# Patient Record
Sex: Female | Born: 1970 | Race: White | Hispanic: No | Marital: Married | Smoking: Never smoker
Health system: Southern US, Community
[De-identification: ages and names within clinical notes are randomized; demographics above are authoritative.]

## PROBLEM LIST (undated history)

## (undated) DIAGNOSIS — E079 Disorder of thyroid, unspecified: Secondary | ICD-10-CM

## (undated) HISTORY — DX: Disorder of thyroid, unspecified: E07.9

---

## 1997-10-29 ENCOUNTER — Other Ambulatory Visit: Admission: RE | Admit: 1997-10-29 | Discharge: 1997-10-29 | Payer: Self-pay | Admitting: Obstetrics and Gynecology

## 2003-03-04 ENCOUNTER — Other Ambulatory Visit: Admission: RE | Admit: 2003-03-04 | Discharge: 2003-03-04 | Payer: Self-pay | Admitting: Obstetrics and Gynecology

## 2003-10-02 ENCOUNTER — Inpatient Hospital Stay (HOSPITAL_COMMUNITY): Admission: AD | Admit: 2003-10-02 | Discharge: 2003-10-04 | Payer: Self-pay | Admitting: Obstetrics and Gynecology

## 2003-11-09 ENCOUNTER — Other Ambulatory Visit: Admission: RE | Admit: 2003-11-09 | Discharge: 2003-11-09 | Payer: Self-pay | Admitting: Obstetrics and Gynecology

## 2005-01-30 ENCOUNTER — Other Ambulatory Visit: Admission: RE | Admit: 2005-01-30 | Discharge: 2005-01-30 | Payer: Self-pay | Admitting: Obstetrics and Gynecology

## 2005-08-14 ENCOUNTER — Inpatient Hospital Stay (HOSPITAL_COMMUNITY): Admission: AD | Admit: 2005-08-14 | Discharge: 2005-08-16 | Payer: Self-pay | Admitting: Obstetrics and Gynecology

## 2005-08-14 ENCOUNTER — Encounter (INDEPENDENT_AMBULATORY_CARE_PROVIDER_SITE_OTHER): Payer: Self-pay | Admitting: *Deleted

## 2007-03-05 ENCOUNTER — Emergency Department (HOSPITAL_COMMUNITY): Admission: EM | Admit: 2007-03-05 | Discharge: 2007-03-05 | Payer: Self-pay | Admitting: Emergency Medicine

## 2008-07-12 ENCOUNTER — Encounter: Admission: RE | Admit: 2008-07-12 | Discharge: 2008-07-12 | Payer: Self-pay | Admitting: Family Medicine

## 2010-12-22 LAB — I-STAT 8, (EC8 V) (CONVERTED LAB)
Acid-base deficit: 4 — ABNORMAL HIGH
BUN: 13
Chloride: 108
Glucose, Bld: 91
HCT: 40
Hemoglobin: 13.6
Operator id: 198171
pCO2, Ven: 41 — ABNORMAL LOW

## 2010-12-22 LAB — URINALYSIS, ROUTINE W REFLEX MICROSCOPIC
Glucose, UA: NEGATIVE
Hgb urine dipstick: NEGATIVE
Ketones, ur: NEGATIVE
Nitrite: NEGATIVE
Specific Gravity, Urine: 1.026
Urobilinogen, UA: 0.2
pH: 5.5

## 2010-12-22 LAB — DIFFERENTIAL
Basophils Relative: 0
Eosinophils Absolute: 0.2
Lymphocytes Relative: 29
Lymphs Abs: 1.2
Monocytes Relative: 6
Neutro Abs: 2.4
Neutrophils Relative %: 60

## 2010-12-22 LAB — CBC
HCT: 37.6
Platelets: 177
RBC: 4.35
RDW: 13.1

## 2010-12-22 LAB — POCT I-STAT CREATININE: Operator id: 198171

## 2010-12-22 LAB — WET PREP, GENITAL: Yeast Wet Prep HPF POC: NONE SEEN

## 2010-12-22 LAB — POCT PREGNANCY, URINE: Operator id: 198171

## 2010-12-22 LAB — TYPE AND SCREEN: ABO/RH(D): O POS

## 2010-12-22 LAB — ABO/RH: ABO/RH(D): O POS

## 2015-12-07 ENCOUNTER — Other Ambulatory Visit: Payer: Self-pay | Admitting: Obstetrics and Gynecology

## 2015-12-07 DIAGNOSIS — R928 Other abnormal and inconclusive findings on diagnostic imaging of breast: Secondary | ICD-10-CM

## 2015-12-12 ENCOUNTER — Ambulatory Visit
Admission: RE | Admit: 2015-12-12 | Discharge: 2015-12-12 | Disposition: A | Discharge status: Home / Self Care | Payer: Commercial Managed Care - PPO | Source: Ambulatory Visit | Attending: Obstetrics and Gynecology | Admitting: Obstetrics and Gynecology

## 2015-12-12 DIAGNOSIS — R928 Other abnormal and inconclusive findings on diagnostic imaging of breast: Secondary | ICD-10-CM

## 2016-08-22 LAB — CBC AND DIFFERENTIAL
HCT: 37 (ref 36–46)
Hemoglobin: 12.5 (ref 12.0–16.0)
Platelets: 206 (ref 150–399)
WBC: 4.2

## 2016-08-22 LAB — BASIC METABOLIC PANEL
BUN: 12 (ref 4–21)
CREATININE: 1 (ref 0.5–1.1)
GLUCOSE: 83
POTASSIUM: 4.4 (ref 3.4–5.3)
SODIUM: 142 (ref 137–147)

## 2016-08-22 LAB — HEPATIC FUNCTION PANEL
ALT: 14 (ref 7–35)
AST: 24 (ref 13–35)
Alkaline Phosphatase: 52 (ref 25–125)

## 2016-08-22 LAB — VITAMIN B12: VITAMIN B 12: 843

## 2016-08-22 LAB — LIPID PANEL
Cholesterol: 208 — AB (ref 0–200)
HDL: 72 — AB (ref 35–70)
LDL CALC: 123
Triglycerides: 64 (ref 40–160)

## 2017-02-22 DIAGNOSIS — Z01419 Encounter for gynecological examination (general) (routine) without abnormal findings: Secondary | ICD-10-CM | POA: Diagnosis not present

## 2017-02-22 DIAGNOSIS — Z0142 Encounter for cervical smear to confirm findings of recent normal smear following initial abnormal smear: Secondary | ICD-10-CM | POA: Diagnosis not present

## 2017-02-22 DIAGNOSIS — Z6823 Body mass index (BMI) 23.0-23.9, adult: Secondary | ICD-10-CM | POA: Diagnosis not present

## 2017-02-22 DIAGNOSIS — Z1231 Encounter for screening mammogram for malignant neoplasm of breast: Secondary | ICD-10-CM | POA: Diagnosis not present

## 2017-04-02 ENCOUNTER — Ambulatory Visit (INDEPENDENT_AMBULATORY_CARE_PROVIDER_SITE_OTHER): Payer: 59 | Admitting: Family Medicine

## 2017-04-02 ENCOUNTER — Encounter: Payer: Self-pay | Admitting: Family Medicine

## 2017-04-02 VITALS — BP 110/70 | HR 77 | Ht 63.5 in | Wt 135.5 lb

## 2017-04-02 DIAGNOSIS — Z0000 Encounter for general adult medical examination without abnormal findings: Secondary | ICD-10-CM

## 2017-04-02 DIAGNOSIS — E039 Hypothyroidism, unspecified: Secondary | ICD-10-CM | POA: Diagnosis not present

## 2017-04-02 LAB — TSH: TSH: 1.56 u[IU]/mL (ref 0.35–4.50)

## 2017-04-02 NOTE — Progress Notes (Addendum)
Subjective:  Patient ID: Erica RossettiEleanor G Wallace, female    DOB: [birth year]  Age: 47 y.o. MRN: [id number]  CC: Establish Care   HPI Erica Rossettileanor G Attia presents for establishment of care and to follow-up for her hypothyroidism.  She otherwise enjoys excellent health.  She is married and the mother or boys to her grown 1 of which is in the [employer].  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  She exercises on a regular basis.  Normal Pap smear this past year.  She has been seeing Deneen HartsElizabeth Todd who is since retired.  Review of her lab work back in June was all normal.  She does not smoke or use illicit drugs.  She drinks alcohol very occasionally.  Her husband is a Transport plannercardiac nurse and works in [employer].  History Erica Wallace has a past medical history of Thyroid disease.   She has no past surgical history on file.   Her family history includes Alzheimer's disease in her paternal grandmother; Cancer in her maternal grandmother; Diabetes in her paternal grandfather; Heart attack in her paternal grandfather; Hyperlipidemia in her maternal grandmother; Hypertension in her father; Hypothyroidism in her mother; Stroke in her maternal grandfather.She reports that  has never smoked. she has never used smokeless tobacco. Her alcohol and drug histories are not on file.  Outpatient Medications Prior to Visit  Medication Sig Dispense Refill  . levothyroxine (SYNTHROID, LEVOTHROID) 75 MCG tablet Take 1 tablet by mouth daily.     No facility-administered medications prior to visit.     ROS Review of Systems  Constitutional: Negative for chills, fatigue and fever.  HENT: Negative.   Eyes: Negative.   Respiratory: Negative.   Cardiovascular: Negative.   Gastrointestinal: Negative.   Endocrine: Negative for polyphagia and polyuria.  Genitourinary: Negative.   Musculoskeletal: Negative for arthralgias and myalgias.  Skin: Negative for color change and rash.  Neurological: Negative for weakness and headaches.    Hematological: Does not bruise/bleed easily.  Psychiatric/Behavioral: Negative.     Objective:  BP 110/70 (BP Location: Right Arm, Patient Position: Sitting, Cuff Size: Normal)   Pulse 77   Ht 5' 3.5" (1.613 m)   Wt 135 lb 8 oz (61.5 kg)   SpO2 99%   BMI 23.63 kg/m   Physical Exam  Constitutional: She is oriented to person, place, and time. She appears well-developed and well-nourished. No distress.  HENT:  Head: Normocephalic and atraumatic.  Right Ear: External ear normal.  Left Ear: External ear normal.  Mouth/Throat: Oropharynx is clear and moist. No oropharyngeal exudate.  Eyes: Conjunctivae are normal. Pupils are equal, round, and reactive to light. Right eye exhibits no discharge. Left eye exhibits no discharge. No scleral icterus.  Neck: Neck supple. No JVD present. No tracheal deviation present. No thyromegaly present.  Cardiovascular: Normal rate, regular rhythm and normal heart sounds.  Pulmonary/Chest: Effort normal and breath sounds normal. No stridor. No respiratory distress. She has no wheezes. She has no rales.  Lymphadenopathy:    She has no cervical adenopathy.  Neurological: She is alert and oriented to person, place, and time.  Skin: Skin is warm and dry. She is not diaphoretic.  Psychiatric: She has a normal mood and affect. Her behavior is normal.      Assessment & Plan:   Erica Wallace was seen today for establish care.  Diagnoses and all orders for this visit:  Acquired hypothyroidism -     TSH -     levothyroxine (SYNTHROID, LEVOTHROID) 75  MCG tablet; Take 1 tablet (75 mcg total) by mouth daily before breakfast.  Healthcare maintenance -     HIV antibody   I have changed Chuck Hint. Jurewicz's levothyroxine.  Meds ordered this encounter  Medications  . levothyroxine (SYNTHROID, LEVOTHROID) 75 MCG tablet    Sig: Take 1 tablet (75 mcg total) by mouth daily before breakfast.    Dispense:  100 tablet    Refill:  3     Follow-up: Return in about  6 months (around 09/30/2017).  Mliss Sax, MD

## 2017-04-03 LAB — HIV ANTIBODY (ROUTINE TESTING W REFLEX): HIV 1&2 Ab, 4th Generation: NONREACTIVE

## 2017-04-03 MED ORDER — LEVOTHYROXINE SODIUM 75 MCG PO TABS: 75.0000 ug | ORAL_TABLET | Freq: Every day | ORAL | 3 refills | Status: DC

## 2017-04-03 NOTE — Addendum Note (Signed)
Addended by: Andrez GrimeKREMER, Marieta Markov A on: 04/03/2017 08:29 AM   Modules accepted: Orders

## 2017-04-04 ENCOUNTER — Encounter: Payer: Self-pay | Admitting: Family Medicine

## 2017-04-22 ENCOUNTER — Encounter: Payer: Self-pay | Admitting: Family Medicine

## 2017-07-01 DIAGNOSIS — Z00129 Encounter for routine child health examination without abnormal findings: Secondary | ICD-10-CM | POA: Diagnosis not present

## 2018-02-27 ENCOUNTER — Encounter: Payer: Self-pay | Admitting: Family Medicine

## 2018-02-27 ENCOUNTER — Ambulatory Visit (INDEPENDENT_AMBULATORY_CARE_PROVIDER_SITE_OTHER): Payer: 59 | Admitting: Family Medicine

## 2018-02-27 VITALS — BP 118/70 | HR 96 | Ht 63.5 in | Wt 139.0 lb

## 2018-02-27 DIAGNOSIS — S39012A Strain of muscle, fascia and tendon of lower back, initial encounter: Secondary | ICD-10-CM | POA: Diagnosis not present

## 2018-02-27 MED ORDER — PREDNISONE 10 MG (48) PO TBPK: ORAL_TABLET | ORAL | 0 refills | Status: DC

## 2018-02-27 MED ORDER — CYCLOBENZAPRINE HCL 10 MG PO TABS: 10.0000 mg | ORAL_TABLET | Freq: Every day | ORAL | 0 refills | Status: DC

## 2018-02-27 NOTE — Progress Notes (Signed)
Established Patient Office Visit  Subjective:  Patient ID: Erica Wallace, female    DOB: Aug 08, 1970  Age: 47 y.o. MRN: [id number]  CC:  Chief Complaint  Patient presents with  . right hip pain    HPI Erica Wallace presents for evaluation of right hip pain. It has been ongoing for a month. She was doing some exercises on an excersie ball.  Patient has been on experiencing moderate to severe pain in her lower back into her right pelvic area.  She can think of no specific injury.  She has been working with an exercise ball she has been physically active over the years playing softball and baseball and other sports.  She is active with her 51 and25 year old children.  Pain is in the lower back and right pelvic area.  There is no radiation of pain down the back of her leg.  She is also experienced right calf pain with the lower back pain.  She has felt some numbness in her toes that is relieved with activity.  She denies changes in her bowel or bladder function.  There are no saddle paresthesias.  She has been taking Aleve intermittently as needed.  Past Medical History:  Diagnosis Date  . Thyroid disease     History reviewed. No pertinent surgical history.  Family History  Problem Relation Age of Onset  . Hypertension Father   . Stroke Maternal Grandfather   . Cancer Maternal Grandmother   . Hyperlipidemia Maternal Grandmother   . Hypothyroidism Mother   . Diabetes Paternal Grandfather   . Heart attack Paternal Grandfather   . Alzheimer's disease Paternal Grandmother     Social History   Socioeconomic History  . Marital status: Married    Spouse name: Not on file  . Number of children: Not on file  . Years of education: Not on file  . Highest education level: Not on file  Occupational History  . Not on file  Social Needs  . Financial resource strain: Not on file  . Food insecurity:    Worry: Not on file    Inability: Not on file  . Transportation needs:   Medical: Not on file    Non-medical: Not on file  Tobacco Use  . Smoking status: Never Smoker  . Smokeless tobacco: Never Used  Substance and Sexual Activity  . Alcohol use: Yes  . Drug use: No  . Sexual activity: Yes    Partners: Male  Lifestyle  . Physical activity:    Days per week: Not on file    Minutes per session: Not on file  . Stress: Not on file  Relationships  . Social connections:    Talks on phone: Not on file    Gets together: Not on file    Attends religious service: Not on file    Active member of club or organization: Not on file    Attends meetings of clubs or organizations: Not on file    Relationship status: Not on file  . Intimate partner violence:    Fear of current or ex partner: Not on file    Emotionally abused: Not on file    Physically abused: Not on file    Forced sexual activity: Not on file  Other Topics Concern  . Not on file  Social History Narrative  . Not on file    Outpatient Medications Prior to Visit  Medication Sig Dispense Refill  . levothyroxine (SYNTHROID, LEVOTHROID) 75 MCG tablet Take 1  tablet (75 mcg total) by mouth daily before breakfast. 100 tablet 3   No facility-administered medications prior to visit.     Not on File  ROS Review of Systems  Constitutional: Negative.   Respiratory: Negative.   Cardiovascular: Negative.   Gastrointestinal: Negative.   Genitourinary: Negative.   Musculoskeletal: Positive for back pain and myalgias.  Skin: Negative for pallor and rash.  Allergic/Immunologic: Negative for immunocompromised state.  Neurological: Positive for numbness. Negative for weakness.  Hematological: Does not bruise/bleed easily.  Psychiatric/Behavioral: Negative.       Objective:    Physical Exam  Constitutional: She is oriented to person, place, and time. She appears well-developed and well-nourished. No distress.  HENT:  Head: Normocephalic and atraumatic.  Right Ear: External ear normal.  Left Ear:  External ear normal.  Eyes: Right eye exhibits no discharge. Left eye exhibits no discharge. No scleral icterus.  Neck: No tracheal deviation present.  Pulmonary/Chest: Effort normal. No stridor.  Musculoskeletal:     Lumbar back: She exhibits decreased range of motion, tenderness and bony tenderness. She exhibits no spasm.       Back:  Neurological: She is alert and oriented to person, place, and time. She has normal strength.  Reflex Scores:      Patellar reflexes are 2+ on the right side and 2+ on the left side.      Achilles reflexes are 1+ on the right side and 1+ on the left side. Negative dural tension symptoms.   Skin: Skin is warm and dry. She is not diaphoretic.  Psychiatric: She has a normal mood and affect. Her behavior is normal.    BP 118/70   Pulse 96   Ht 5' 3.5" (1.613 m)   Wt 139 lb (63 kg)   SpO2 99%   BMI 24.24 kg/m  Wt Readings from Last 3 Encounters:  02/27/18 139 lb (63 kg)  04/02/17 135 lb 8 oz (61.5 kg)   BP Readings from Last 3 Encounters:  02/27/18 118/70  04/02/17 110/70   Health Maintenance Due  Topic Date Due  . TETANUS/TDAP  11/02/1990  . INFLUENZA VACCINE  10/17/2017    There are no preventive care reminders to display for this patient.  Lab Results  Component Value Date   TSH 1.56 04/02/2017   Lab Results  Component Value Date   WBC 4.2 08/22/2016   HGB 12.5 08/22/2016   HCT 37 08/22/2016   MCV 86.4 03/05/2007   PLT 206 08/22/2016   Lab Results  Component Value Date   NA 142 08/22/2016   K 4.4 08/22/2016   GLUCOSE 91 03/05/2007   BUN 12 08/22/2016   CREATININE 1.0 08/22/2016   ALKPHOS 52 08/22/2016   AST 24 08/22/2016   ALT 14 08/22/2016   Lab Results  Component Value Date   CHOL 208 (A) 08/22/2016   Lab Results  Component Value Date   HDL 72 (A) 08/22/2016   Lab Results  Component Value Date   LDLCALC 123 08/22/2016   Lab Results  Component Value Date   TRIG 64 08/22/2016   No results found for:  CHOLHDL No results found for: [id number]    Assessment & Plan:   Problem List Items Addressed This Visit      Musculoskeletal and Integument   Strain of lumbar region - Primary   Relevant Medications   cyclobenzaprine (FLEXERIL) 10 MG tablet   predniSONE (STERAPRED UNI-PAK 48 TAB) 10 MG (48) TBPK tablet   Other Relevant Orders  DG Lumbar Spine Complete      Meds ordered this encounter  Medications  . cyclobenzaprine (FLEXERIL) 10 MG tablet    Sig: Take 1 tablet (10 mg total) by mouth at bedtime.    Dispense:  30 tablet    Refill:  0  . predniSONE (STERAPRED UNI-PAK 48 TAB) 10 MG (48) TBPK tablet    Sig: Pharm to instruct a 12 day taper    Dispense:  48 tablet    Refill:  0    Follow-up: Return in about 2 weeks (around 03/13/2018), or if symptoms worsen or fail to improve.    Patient was given exercises to do.  Will start a 12-day prednisone Dosepak.  She has taken prednisone in the past.  Warned her of irritability and increased appetite.  She will use Flexeril at night as needed pain.  We will go ahead and check plain films today due to the length of her illness and its presentation.  Follow-up if not improving in a few weeks

## 2018-02-27 NOTE — Patient Instructions (Signed)

## 2018-04-07 ENCOUNTER — Encounter: Payer: Self-pay | Admitting: Family Medicine

## 2018-04-07 ENCOUNTER — Ambulatory Visit (INDEPENDENT_AMBULATORY_CARE_PROVIDER_SITE_OTHER): Payer: No Typology Code available for payment source | Admitting: Family Medicine

## 2018-04-07 VITALS — BP 104/70 | HR 68 | Ht 63.5 in

## 2018-04-07 DIAGNOSIS — M5416 Radiculopathy, lumbar region: Secondary | ICD-10-CM | POA: Diagnosis not present

## 2018-04-07 DIAGNOSIS — E039 Hypothyroidism, unspecified: Secondary | ICD-10-CM

## 2018-04-07 MED ORDER — TRAMADOL HCL 50 MG PO TABS: 50.0000 mg | ORAL_TABLET | Freq: Four times a day (QID) | ORAL | 0 refills | Status: DC | PRN

## 2018-04-07 MED ORDER — METHYLPREDNISOLONE ACETATE 80 MG/ML IJ SUSP
80.0000 mg | Freq: Once | INTRAMUSCULAR | Status: AC
  Administered 2018-04-07: 80 mg via INTRAMUSCULAR

## 2018-04-07 NOTE — Progress Notes (Addendum)
Established Patient Office Visit  Subjective:  Patient ID: Erica Wallace, female    DOB: [birth year]  Age: 48 y.o. MRN: [id number]  CC:  Chief Complaint  Patient presents with  . Follow-up    HPI Erica Rossettileanor G Spoonemore presents for evaluation of ongoing hip and lower back pain. She was treated with a steroid taper and a muscle relaxer last month. The steroid helped, but she could still feel the pain. The muscle relaxer did not help at all.  Patient is now having pain in the low right lower back and pelvic area.  There is there is also pain radiating down the back of her right leg to her calf.  There is been no weakness or paresthesias.  No bowel or bladder incontinence.  Prednisone helped while she was taking it but then symptoms have returned.  Flexeril was not helpful for her.  Altogether her symptoms have been going on for about 2 months.  Radiation of pain from the buttock area into the leg is relatively new.  She has been taking her thyroid medicine on a fasting stomach an hour before eating.  Past Medical History:  Diagnosis Date  . Thyroid disease     History reviewed. No pertinent surgical history.  Family History  Problem Relation Age of Onset  . Hypertension Father   . Stroke Maternal Grandfather   . Cancer Maternal Grandmother   . Hyperlipidemia Maternal Grandmother   . Hypothyroidism Mother   . Diabetes Paternal Grandfather   . Heart attack Paternal Grandfather   . Alzheimer's disease Paternal Grandmother     Social History   Socioeconomic History  . Marital status: Married    Spouse name: Not on file  . Number of children: Not on file  . Years of education: Not on file  . Highest education level: Not on file  Occupational History  . Not on file  Social Needs  . Financial resource strain: Not on file  . Food insecurity:    Worry: Not on file    Inability: Not on file  . Transportation needs:    Medical: Not on file    Non-medical: Not on file  Tobacco Use    . Smoking status: Never Smoker  . Smokeless tobacco: Never Used  Substance and Sexual Activity  . Alcohol use: Yes  . Drug use: No  . Sexual activity: Yes    Partners: Male  Lifestyle  . Physical activity:    Days per week: Not on file    Minutes per session: Not on file  . Stress: Not on file  Relationships  . Social connections:    Talks on phone: Not on file    Gets together: Not on file    Attends religious service: Not on file    Active member of club or organization: Not on file    Attends meetings of clubs or organizations: Not on file    Relationship status: Not on file  . Intimate partner violence:    Fear of current or ex partner: Not on file    Emotionally abused: Not on file    Physically abused: Not on file    Forced sexual activity: Not on file  Other Topics Concern  . Not on file  Social History Narrative  . Not on file    Outpatient Medications Prior to Visit  Medication Sig Dispense Refill  . cyclobenzaprine (FLEXERIL) 10 MG tablet Take 1 tablet (10 mg total) by mouth at bedtime. 30  tablet 0  . levothyroxine (SYNTHROID, LEVOTHROID) 75 MCG tablet Take 1 tablet (75 mcg total) by mouth daily before breakfast. 100 tablet 3  . predniSONE (STERAPRED UNI-PAK 48 TAB) 10 MG (48) TBPK tablet Pharm to instruct a 12 day taper 48 tablet 0   No facility-administered medications prior to visit.     Not on File  ROS Review of Systems  Constitutional: Negative for diaphoresis, fatigue, fever and unexpected weight change.  Respiratory: Negative.   Cardiovascular: Negative.   Gastrointestinal: Negative.   Endocrine: Negative for cold intolerance and heat intolerance.  Genitourinary: Negative.   Musculoskeletal: Positive for back pain.  Skin: Negative.   Allergic/Immunologic: Negative for immunocompromised state.  Neurological: Negative for weakness and numbness.  Hematological: Does not bruise/bleed easily.  Psychiatric/Behavioral: Negative.       Objective:     Physical Exam  Constitutional: She is oriented to person, place, and time. She appears well-developed and well-nourished. No distress.  HENT:  Head: Normocephalic and atraumatic.  Right Ear: External ear normal.  Left Ear: External ear normal.  Eyes: Right eye exhibits no discharge. Left eye exhibits no discharge. No scleral icterus.  Pulmonary/Chest: Effort normal and breath sounds normal.  Musculoskeletal:     Lumbar back: She exhibits decreased range of motion. She exhibits no tenderness, no bony tenderness and no spasm.       Back:  Neurological: She is alert and oriented to person, place, and time. She has normal strength.  Straight leg raise on contralateral side led to pain in the lower back.   Skin: Skin is warm and dry. She is not diaphoretic.  Psychiatric: She has a normal mood and affect. Her behavior is normal.    BP 104/70   Pulse 68   Ht 5' 3.5" (1.613 m)   SpO2 99%   BMI 24.24 kg/m  Wt Readings from Last 3 Encounters:  02/27/18 139 lb (63 kg)  04/02/17 135 lb 8 oz (61.5 kg)   BP Readings from Last 3 Encounters:  04/07/18 104/70  02/27/18 118/70  04/02/17 110/70   Guideline developer:  UpToDate (see UpToDate for funding source) Date Released: June 2014  Health Maintenance Due  Topic Date Due  . Janet Berlin  11/02/1990  . INFLUENZA VACCINE  10/17/2017    There are no preventive care reminders to display for this patient.  Lab Results  Component Value Date   TSH 1.92 04/07/2018   Lab Results  Component Value Date   WBC 4.2 08/22/2016   HGB 12.5 08/22/2016   HCT 37 08/22/2016   MCV 86.4 03/05/2007   PLT 206 08/22/2016   Lab Results  Component Value Date   NA 142 08/22/2016   K 4.4 08/22/2016   GLUCOSE 91 03/05/2007   BUN 12 08/22/2016   CREATININE 1.0 08/22/2016   ALKPHOS 52 08/22/2016   AST 24 08/22/2016   ALT 14 08/22/2016   Lab Results  Component Value Date   CHOL 208 (A) 08/22/2016   Lab Results  Component Value Date   HDL  72 (A) 08/22/2016   Lab Results  Component Value Date   LDLCALC 123 08/22/2016   Lab Results  Component Value Date   TRIG 64 08/22/2016   No results found for: CHOLHDL No results found for: SUOR5I    Assessment & Plan:   Problem List Items Addressed This Visit      Endocrine   Acquired hypothyroidism   Relevant Medications   levothyroxine (SYNTHROID, LEVOTHROID) 75 MCG tablet  Other Relevant Orders   TSH (Completed)     Nervous and Auditory   Lumbar radiculopathy - Primary   Relevant Medications   methylPREDNISolone acetate (DEPO-MEDROL) injection 80 mg (Completed)   traMADol (ULTRAM) 50 MG tablet   Other Relevant Orders   MR Lumbar Spine Wo Contrast      Meds ordered this encounter  Medications  . methylPREDNISolone acetate (DEPO-MEDROL) injection 80 mg  . traMADol (ULTRAM) 50 MG tablet    Sig: Take 1 tablet (50 mg total) by mouth every 6 (six) hours as needed.    Dispense:  40 tablet    Refill:  0  . levothyroxine (SYNTHROID, LEVOTHROID) 75 MCG tablet    Sig: Take 1 tablet (75 mcg total) by mouth daily before breakfast.    Dispense:  100 tablet    Refill:  3    Follow-up: Return in about 2 weeks (around 04/21/2018).   Patient is aware that the Depo-Medrol may take a few days to become effective.  She will use the Ultram as needed for pain.  Drowsy precautions were given.  MRI is pending.

## 2018-04-07 NOTE — Patient Instructions (Signed)
Radicular Pain Radicular pain is a type of pain that spreads from your back or neck along a spinal nerve. Spinal nerves are nerves that leave the spinal cord and go to the muscles. Radicular pain is sometimes called radiculopathy, radiculitis, or a pinched nerve. When you have this type of pain, you may also have weakness, numbness, or tingling in the area of your body that is supplied by the nerve. The pain may feel sharp and burning. Depending on which spinal nerve is affected, the pain may occur in the:  Neck area (cervical radicular pain). You may also feel pain, numbness, weakness, or tingling in the arms.  Mid-spine area (thoracic radicular pain). You would feel this pain in the back and chest. This type is rare.  Lower back area (lumbar radicular pain). You would feel this pain as low back pain. You may feel pain, numbness, weakness, or tingling in the buttocks or legs. Sciatica is a type of lumbar radicular pain that shoots down the back of the leg. Radicular pain occurs when one of the spinal nerves becomes irritated or squeezed (compressed). It is often caused by something pushing on a spinal nerve, such as one of the bones of the spine (vertebrae) or one of the round cushions between vertebrae (intervertebral disks). This can result from:  An injury.  Wear and tear or aging of a disk.  The growth of a bone spur that pushes on the nerve. Radicular pain often goes away when you follow instructions from your health care provider for relieving pain at home. Follow these instructions at home: Managing pain      If directed, put ice on the affected area: ? Put ice in a plastic bag. ? Place a towel between your skin and the bag. ? Leave the ice on for 20 minutes, 2-3 times a day.  If directed, apply heat to the affected area as often as told by your health care provider. Use the heat source that your health care provider recommends, such as a moist heat pack or a heating pad. ? Place  a towel between your skin and the heat source. ? Leave the heat on for 20-30 minutes. ? Remove the heat if your skin turns bright red. This is especially important if you are unable to feel pain, heat, or cold. You may have a greater risk of getting burned. Activity   Do not sit or rest in bed for long periods of time.  Try to stay as active as possible. Ask your health care provider what type of exercise or activity is best for you.  Avoid activities that make your pain worse, such as bending and lifting.  Do not lift anything that is heavier than 10 lb (4.5 kg), or the limit that you are told, until your health care provider says that it is safe.  Practice using proper technique when lifting items. Proper lifting technique involves bending your knees and rising up.  Do strength and range-of-motion exercises only as told by your health care provider or physical therapist. General instructions  Take over-the-counter and prescription medicines only as told by your health care provider.  Pay attention to any changes in your symptoms.  Keep all follow-up visits as told by your health care provider. This is important. ? Your health care provider may send you to a physical therapist to help with this pain. Contact a health care provider if:  Your pain and other symptoms get worse.  Your pain medicine is not   helping.  Your pain has not improved after a few weeks of home care.  You have a fever. Get help right away if:  You have severe pain, weakness, or numbness.  You have difficulty with bladder or bowel control. Summary  Radicular pain is a type of pain that spreads from your back or neck along a spinal nerve.  When you have radicular pain, you may also have weakness, numbness, or tingling in the area of your body that is supplied by the nerve.  The pain may feel sharp or burning.  Radicular pain may be treated with ice, heat, medicines, or physical therapy. This  information is not intended to replace advice given to you by your health care provider. Make sure you discuss any questions you have with your health care provider. Document Released: 04/12/2004 Document Revised: 09/17/2017 Document Reviewed: 09/17/2017 Elsevier Interactive Patient Education  2019 [employer]  

## 2018-04-08 LAB — TSH: TSH: 1.92 u[IU]/mL (ref 0.35–4.50)

## 2018-04-08 MED ORDER — LEVOTHYROXINE SODIUM 75 MCG PO TABS: 75.0000 ug | ORAL_TABLET | Freq: Every day | ORAL | 3 refills | Status: DC

## 2018-04-08 NOTE — Addendum Note (Signed)
Addended by: Andrez Grime on: 04/08/2018 12:17 PM   Modules accepted: Orders

## 2018-04-12 ENCOUNTER — Ambulatory Visit (HOSPITAL_BASED_OUTPATIENT_CLINIC_OR_DEPARTMENT_OTHER)
Admission: RE | Admit: 2018-04-12 | Discharge: 2018-04-12 | Disposition: A | Discharge status: Home / Self Care | Payer: No Typology Code available for payment source | Source: Ambulatory Visit | Attending: Family Medicine | Admitting: Family Medicine

## 2018-04-12 DIAGNOSIS — M5416 Radiculopathy, lumbar region: Secondary | ICD-10-CM | POA: Diagnosis not present

## 2018-04-25 ENCOUNTER — Ambulatory Visit (INDEPENDENT_AMBULATORY_CARE_PROVIDER_SITE_OTHER): Payer: No Typology Code available for payment source | Admitting: Family Medicine

## 2018-04-25 ENCOUNTER — Encounter: Payer: Self-pay | Admitting: Family Medicine

## 2018-04-25 VITALS — BP 110/60 | HR 77 | Ht 63.5 in | Wt 139.5 lb

## 2018-04-25 DIAGNOSIS — M5416 Radiculopathy, lumbar region: Secondary | ICD-10-CM | POA: Diagnosis not present

## 2018-04-25 DIAGNOSIS — M5127 Other intervertebral disc displacement, lumbosacral region: Secondary | ICD-10-CM | POA: Insufficient documentation

## 2018-04-25 MED ORDER — TRAMADOL HCL 50 MG PO TABS: 50.0000 mg | ORAL_TABLET | Freq: Four times a day (QID) | ORAL | 0 refills | Status: DC | PRN

## 2018-04-25 MED ORDER — GABAPENTIN 300 MG PO CAPS: ORAL_CAPSULE | ORAL | 3 refills | Status: DC

## 2018-04-25 MED ORDER — PREDNISONE 10 MG (48) PO TBPK: ORAL_TABLET | ORAL | 0 refills | Status: DC

## 2018-04-25 NOTE — Progress Notes (Signed)
Established Patient Office Visit  Subjective:  Patient ID: Erica Wallace, female    DOB: September 30, 1970  Age: 48 y.o. MRN: [id number]  CC:  Chief Complaint  Patient presents with  . Follow-up    HPI Erica Wallace presents for follow-up of her L5-S1 herniated disc to the right.  She is holding her own.  Continues to have some pain radiating down into the right leg.  Left leg is not affected.  There are no saddle paresthesias.  There are no changes in her bowel or bladder function.  She had tolerated the prednisone well.  Past Medical History:  Diagnosis Date  . Thyroid disease     History reviewed. No pertinent surgical history.  Family History  Problem Relation Age of Onset  . Hypertension Father   . Stroke Maternal Grandfather   . Cancer Maternal Grandmother   . Hyperlipidemia Maternal Grandmother   . Hypothyroidism Mother   . Diabetes Paternal Grandfather   . Heart attack Paternal Grandfather   . Alzheimer's disease Paternal Grandmother     Social History   Socioeconomic History  . Marital status: Married    Spouse name: Not on file  . Number of children: Not on file  . Years of education: Not on file  . Highest education level: Not on file  Occupational History  . Not on file  Social Needs  . Financial resource strain: Not on file  . Food insecurity:    Worry: Not on file    Inability: Not on file  . Transportation needs:    Medical: Not on file    Non-medical: Not on file  Tobacco Use  . Smoking status: Never Smoker  . Smokeless tobacco: Never Used  Substance and Sexual Activity  . Alcohol use: Yes  . Drug use: No  . Sexual activity: Yes    Partners: Male  Lifestyle  . Physical activity:    Days per week: Not on file    Minutes per session: Not on file  . Stress: Not on file  Relationships  . Social connections:    Talks on phone: Not on file    Gets together: Not on file    Attends religious service: Not on file    Active member of club or  organization: Not on file    Attends meetings of clubs or organizations: Not on file    Relationship status: Not on file  . Intimate partner violence:    Fear of current or ex partner: Not on file    Emotionally abused: Not on file    Physically abused: Not on file    Forced sexual activity: Not on file  Other Topics Concern  . Not on file  Social History Narrative  . Not on file    Outpatient Medications Prior to Visit  Medication Sig Dispense Refill  . levothyroxine (SYNTHROID, LEVOTHROID) 75 MCG tablet Take 1 tablet (75 mcg total) by mouth daily before breakfast. 100 tablet 3  . traMADol (ULTRAM) 50 MG tablet Take 1 tablet (50 mg total) by mouth every 6 (six) hours as needed. 40 tablet 0   No facility-administered medications prior to visit.     No Known Allergies  ROS Review of Systems  Constitutional: Negative.   Respiratory: Negative.   Cardiovascular: Negative.   Gastrointestinal: Negative.   Musculoskeletal: Positive for back pain. Negative for myalgias.  Neurological: Negative for weakness and numbness.  Hematological: Does not bruise/bleed easily.  Psychiatric/Behavioral: Negative.  Objective:    Physical Exam  Constitutional: She is oriented to person, place, and time. She appears well-developed and well-nourished. No distress.  HENT:  Head: Normocephalic and atraumatic.  Right Ear: External ear normal.  Left Ear: External ear normal.  Eyes: Right eye exhibits no discharge. Left eye exhibits no discharge. No scleral icterus.  Neck: No JVD present. No tracheal deviation present.  Pulmonary/Chest: Effort normal. No stridor.  Neurological: She is alert and oriented to person, place, and time.  Skin: Skin is warm and dry. She is not diaphoretic.  Psychiatric: She has a normal mood and affect. Her behavior is normal.    BP 110/60   Pulse 77   Ht 5' 3.5" (1.613 m)   Wt 139 lb 8 oz (63.3 kg)   LMP  (Approximate)   SpO2 99%   BMI 24.32 kg/m  Wt  Readings from Last 3 Encounters:  04/25/18 139 lb 8 oz (63.3 kg)  02/27/18 139 lb (63 kg)  04/02/17 135 lb 8 oz (61.5 kg)   BP Readings from Last 3 Encounters:  04/25/18 110/60  04/07/18 104/70  02/27/18 118/70   Guideline developer:  UpToDate (see UpToDate for funding source) Date Released: June 2014  Health Maintenance Due  Topic Date Due  . Janet Berlin  11/02/1990    There are no preventive care reminders to display for this patient.  Lab Results  Component Value Date   TSH 1.92 04/07/2018   Lab Results  Component Value Date   WBC 4.2 08/22/2016   HGB 12.5 08/22/2016   HCT 37 08/22/2016   MCV 86.4 03/05/2007   PLT 206 08/22/2016   Lab Results  Component Value Date   NA 142 08/22/2016   K 4.4 08/22/2016   GLUCOSE 91 03/05/2007   BUN 12 08/22/2016   CREATININE 1.0 08/22/2016   ALKPHOS 52 08/22/2016   AST 24 08/22/2016   ALT 14 08/22/2016   Lab Results  Component Value Date   CHOL 208 (A) 08/22/2016   Lab Results  Component Value Date   HDL 72 (A) 08/22/2016   Lab Results  Component Value Date   LDLCALC 123 08/22/2016   Lab Results  Component Value Date   TRIG 64 08/22/2016   No results found for: CHOLHDL No results found for: EHUD1S    Assessment & Plan:   Problem List Items Addressed This Visit      Nervous and Auditory   Lumbar radiculopathy - Primary   Relevant Medications   traMADol (ULTRAM) 50 MG tablet   gabapentin (NEURONTIN) 300 MG capsule   predniSONE (STERAPRED UNI-PAK 48 TAB) 10 MG (48) TBPK tablet   Other Relevant Orders   Ambulatory referral to Neurosurgery     Musculoskeletal and Integument   Herniation of intervertebral disc between L5 and S1   Relevant Medications   traMADol (ULTRAM) 50 MG tablet   gabapentin (NEURONTIN) 300 MG capsule   predniSONE (STERAPRED UNI-PAK 48 TAB) 10 MG (48) TBPK tablet   Other Relevant Orders   Ambulatory referral to Neurosurgery      Meds ordered this encounter  Medications  .  traMADol (ULTRAM) 50 MG tablet    Sig: Take 1 tablet (50 mg total) by mouth every 6 (six) hours as needed.    Dispense:  40 tablet    Refill:  0  . gabapentin (NEURONTIN) 300 MG capsule    Sig: Take one at night for one week and then one twice daily as directed.  Dispense:  90 capsule    Refill:  3  . predniSONE (STERAPRED UNI-PAK 48 TAB) 10 MG (48) TBPK tablet    Sig: Pharm to instruct a 12 day dose pack.    Dispense:  48 tablet    Refill:  0    Follow-up: No follow-ups on file.    We will go ahead and start another 12-day course of prednisone.  Patient tolerated it well and it helped past.  Refill for Ultram.  I have added Neurontin.  She will take this at night.  If able she will try to take the Neurontin twice daily.  Cautioned patient with regards to taking normal Neurontin and Ultram together.  There is a crossover to the left with her right L5-S1 disc.  He currently has no symptoms of cauda equinae syndrome.  Probably best to have neurosurgery involved at this point.  Information was given on herniated disc.

## 2018-04-25 NOTE — Patient Instructions (Signed)
Herniated Disk  A herniated disk, also called a ruptured disk or slipped disk, occurs when a disk in the spine bulges out too far. Between the bones in the spine (vertebrae), there are oval disks that are made of a soft, spongy center that is surrounded by a tough outer ring. The disks connect your vertebrae, help your spine move, and absorb shocks from your movement. When you have a herniated disk, the spongy center of the disk bulges out or breaks through the outer ring. It can press on a nerve between the vertebrae and cause pain. This can occur anywhere in the back or neck area, but the lower back is most commonly affected. What are the causes? This condition may be caused by:  Age-related wear and tear. The spongy centers of spinal disks tend to shrink and dry out with age, which makes them more likely to herniate.  Sudden injury, such as a strain or sprain. What increases the risk? Aging is the main risk factor for a herniated disk. Other risk factors include:  Being a man who is 30-50 years old.  Frequently doing activities that involve heavy lifting, bending, or twisting.  Frequently driving for long hours at a time.  Not getting enough exercise.  Being overweight.  Smoking.  Having a family history of back problems or herniated disks.  Being pregnant or giving birth.  Having poor nutrition.  Being tall. What are the signs or symptoms? Symptoms may vary depending on where your herniated disk is located.  A herniated disk in the lower back may cause sharp pain in: ? Part of the arm, leg, hip, or buttocks. ? The back of the lower leg (calf). ? The lower back, spreading down through the leg into the foot (sciatica).  A herniated disk in the neck may cause dizziness and vertigo. It may also cause pain or weakness in: ? The neck. ? The shoulder blades. ? Upper arm, forearm, or fingers.  You may also have muscle weakness. It may be difficult to: ? Lift your leg or  arm. ? Stand on your toes. ? Squeeze tightly with one of your hands.  Other symptoms may include: ? Numbness or tingling in the affected areas of the hands, arms, feet, or legs. ? Inability to control when you urinate or when you have bowel movements. This is a rare but serious sign of a severe herniated disk in the lower back. How is this diagnosed? This condition may be diagnosed based on:  Your symptoms.  Your medical history.  A physical exam. The exam may include: ? Straight-leg test. You will lie on your back while your health care provider lifts your leg, keeping your knee straight. If you feel pain, you likely have a herniated disk. ? Neurological tests. This includes checking for numbness, reflexes, muscle strength, and posture.  Imaging tests, such as: ? X-rays. ? MRI. ? CT scan. ? Electromyogram (EMG) to check the nerves that control muscles. This test may be used to determine which nerves are affected by your herniated disk. How is this treated? Treatment for this condition may include:  A short period of rest. This is usually the first treatment. ? You may be on bed rest for up to 2 days, or you may be instructed to stay home and avoid physical activity. ? If you have a herniated disk in your lower back, avoid sitting as much as possible. Sitting increases pressure on the disk.  Medicines. These may include: ? NSAIDs   to help reduce pain and swelling. ? Muscle relaxants to prevent sudden tightening of the back muscles (back spasms). ? Prescription pain medicines, if you have severe pain.  Steroid injections in the area of the herniated disk. This can help reduce pain and swelling.  Physical therapy to strengthen your back muscles. In many cases, symptoms go away with treatment over a period of days or weeks. You will most likely be free of symptoms after 3-4 months. If other treatments do not help to relieve your symptoms, you may need surgery. Follow these  instructions at home: Medicines  Take over-the-counter and prescription medicines only as told by your health care provider.  Do not drive or use heavy machinery while taking prescription pain medicine. Activity  Rest as directed.  After your rest period: ? Return to your normal activities and gradually begin exercising as told by your health care provider. Ask your health care provider what activities and exercises are safe for you. ? Use good posture. ? Avoid movements that cause pain. ? Do not lift anything that is heavier than 10 lb (4.5 kg) until your health care provider says this is safe. ? Do not sit or stand for long periods of time without changing positions. ? Do not sit for long periods of time without getting up and moving around.  If physical therapy was prescribed, do exercises as instructed.  Aim to strengthen muscles in your back and abdomen with exercises like crunches, swimming, or walking. General instructions  Do not use any products that contain nicotine or tobacco, such as cigarettes and e-cigarettes. These products can delay healing. If you need help quitting, ask your health care provider.  Do not wear high-heeled shoes.  Do not sleep on your belly.  If you are overweight, work with your health care provider to lose weight safely.  To prevent or treat constipation while you are taking prescription pain medicine, your health care provider may recommend that you: ? Drink enough fluid to keep your urine clear or pale yellow. ? Take over-the-counter or prescription medicines. ? Eat foods that are high in fiber, such as fresh fruits and vegetables, whole grains, and beans. ? Limit foods that are high in fat and processed sugars, such as fried and sweet foods.  Keep all follow-up visits as told by your health care provider. This is important. How is this prevented?   Maintain a healthy weight.  Try to avoid stressful situations.  Maintain physical  fitness. Do at least 150 minutes of moderate-intensity exercise each week, such as brisk walking or water aerobics.  When lifting objects: ? Keep your feet at least shoulder-width apart and tighten your abdominal muscles. ? Keep your spine neutral as you bend your knees and hips. It is important to lift using the strength of your legs, not your back. Do not lock your knees straight out. ? Always ask for help to lift heavy or awkward objects. Contact a health care provider if:  You have back pain or neck pain that does not get better after 6 weeks.  You have severe pain in your back, neck, legs, or arms.  You develop numbness, tingling, or weakness in any part of your body. Get help right away if:  You cannot move your arms or legs.  You cannot control when you urinate or have bowel movements.  You feel dizzy or you faint.  You have shortness of breath. This information is not intended to replace advice given to you by   your health care provider. Make sure you discuss any questions you have with your health care provider. Document Released: 03/02/2000 Document Revised: 10/31/2015 Document Reviewed: 10/31/2015 Elsevier Interactive Patient Education  2019 Elsevier Inc[employer]  

## 2019-04-07 ENCOUNTER — Ambulatory Visit: Payer: No Typology Code available for payment source | Attending: Internal Medicine

## 2019-04-07 DIAGNOSIS — Z20822 Contact with and (suspected) exposure to covid-19: Secondary | ICD-10-CM | POA: Diagnosis not present

## 2019-04-08 LAB — NOVEL CORONAVIRUS, NAA: SARS-CoV-2, NAA: NOT DETECTED

## 2019-04-13 ENCOUNTER — Other Ambulatory Visit: Payer: Self-pay | Admitting: Family Medicine

## 2019-04-13 DIAGNOSIS — E039 Hypothyroidism, unspecified: Secondary | ICD-10-CM

## 2019-04-14 NOTE — Telephone Encounter (Signed)
Pt aware Rx sent in, appointment scheduled

## 2019-04-20 ENCOUNTER — Encounter: Payer: Self-pay | Admitting: Family Medicine

## 2019-04-20 ENCOUNTER — Ambulatory Visit (INDEPENDENT_AMBULATORY_CARE_PROVIDER_SITE_OTHER): Payer: BLUE CROSS/BLUE SHIELD | Admitting: Family Medicine

## 2019-04-20 ENCOUNTER — Other Ambulatory Visit: Payer: Self-pay

## 2019-04-20 VITALS — BP 100/62 | HR 88 | Temp 97.8°F | Ht 63.0 in | Wt 138.2 lb

## 2019-04-20 DIAGNOSIS — D649 Anemia, unspecified: Secondary | ICD-10-CM | POA: Diagnosis not present

## 2019-04-20 DIAGNOSIS — E039 Hypothyroidism, unspecified: Secondary | ICD-10-CM

## 2019-04-20 DIAGNOSIS — Z0000 Encounter for general adult medical examination without abnormal findings: Secondary | ICD-10-CM

## 2019-04-20 DIAGNOSIS — Z23 Encounter for immunization: Secondary | ICD-10-CM | POA: Diagnosis not present

## 2019-04-20 DIAGNOSIS — R0981 Nasal congestion: Secondary | ICD-10-CM | POA: Diagnosis not present

## 2019-04-20 DIAGNOSIS — E860 Dehydration: Secondary | ICD-10-CM

## 2019-04-20 MED ORDER — PREDNISONE 20 MG PO TABS: 20.0000 mg | ORAL_TABLET | Freq: Every day | ORAL | 0 refills | Status: AC

## 2019-04-20 MED ORDER — PREDNISONE 20 MG PO TABS: 20.0000 mg | ORAL_TABLET | Freq: Two times a day (BID) | ORAL | 0 refills | Status: DC

## 2019-04-20 MED ORDER — MOMETASONE FUROATE 50 MCG/ACT NA SUSP: 2.0000 | Freq: Every day | NASAL | 12 refills | Status: DC

## 2019-04-20 NOTE — Addendum Note (Signed)
Addended by: Varney Biles on: 04/20/2019 02:32 PM   Modules accepted: Orders

## 2019-04-20 NOTE — Progress Notes (Addendum)
Established Patient Office Visit  Subjective:  Patient ID: Erica Wallace, female    DOB: 01-Dec-1970  Age: 49 y.o. MRN: [id number]  CC:  Chief Complaint  Patient presents with  . Follow-up    follow up on medications, no concerns.    HPI Erica Wallace presents for follow-up of her hypothyroidism.  Continues to take her medication in the morning prior to eating.  As far she knows knows it is well controlled.  Back is doing better.  She is able to exercise without issue.  She is recovering without surgery today.  Was able to go for dental care.  Pap smear is this month with her GYN.  Husband and sons are doing well.  She reports a 2-week history of nasal congestion with frontal sinus pressure and postnasal drip.  Denies fevers chills nausea vomiting cough wheezing.  Past Medical History:  Diagnosis Date  . Thyroid disease     No past surgical history on file.  Family History  Problem Relation Age of Onset  . Hypertension Father   . Stroke Maternal Grandfather   . Cancer Maternal Grandmother   . Hyperlipidemia Maternal Grandmother   . Hypothyroidism Mother   . Diabetes Paternal Grandfather   . Heart attack Paternal Grandfather   . Alzheimer's disease Paternal Grandmother     Social History   Socioeconomic History  . Marital status: Married    Spouse name: Not on file  . Number of children: Not on file  . Years of education: Not on file  . Highest education level: Not on file  Occupational History  . Not on file  Tobacco Use  . Smoking status: Never Smoker  . Smokeless tobacco: Never Used  Substance and Sexual Activity  . Alcohol use: Yes  . Drug use: No  . Sexual activity: Yes    Partners: Male  Other Topics Concern  . Not on file  Social History Narrative  . Not on file   Social Determinants of Health   Financial Resource Strain:   . Difficulty of Paying Living Expenses: Not on file  Food Insecurity:   . Worried About Programme researcher, broadcasting/film/video in the Last  Year: Not on file  . Ran Out of Food in the Last Year: Not on file  Transportation Needs:   . Lack of Transportation (Medical): Not on file  . Lack of Transportation (Non-Medical): Not on file  Physical Activity:   . Days of Exercise per Week: Not on file  . Minutes of Exercise per Session: Not on file  Stress:   . Feeling of Stress : Not on file  Social Connections:   . Frequency of Communication with Friends and Family: Not on file  . Frequency of Social Gatherings with Friends and Family: Not on file  . Attends Religious Services: Not on file  . Active Member of Clubs or Organizations: Not on file  . Attends Banker Meetings: Not on file  . Marital Status: Not on file  Intimate Partner Violence:   . Fear of Current or Ex-Partner: Not on file  . Emotionally Abused: Not on file  . Physically Abused: Not on file  . Sexually Abused: Not on file    Outpatient Medications Prior to Visit  Medication Sig Dispense Refill  . EUTHYROX 75 MCG tablet TAKE 1 TABLET BY MOUTH BEFORE BREAKFAST 30 tablet 0  . gabapentin (NEURONTIN) 300 MG capsule Take one at night for one week and then one twice  daily as directed. 90 capsule 3  . traMADol (ULTRAM) 50 MG tablet Take 1 tablet (50 mg total) by mouth every 6 (six) hours as needed. (Patient not taking: Reported on 04/20/2019) 40 tablet 0  . predniSONE (STERAPRED UNI-PAK 48 TAB) 10 MG (48) TBPK tablet Pharm to instruct a 12 day dose pack. (Patient not taking: Reported on 04/20/2019) 48 tablet 0   No facility-administered medications prior to visit.    No Known Allergies  ROS Review of Systems  Constitutional: Negative for chills, diaphoresis, fatigue, fever and unexpected weight change.  HENT: Positive for congestion, sinus pressure and sinus pain.   Eyes: Negative for photophobia and visual disturbance.  Respiratory: Negative for cough, chest tightness, shortness of breath and wheezing.   Cardiovascular: Negative.   Gastrointestinal:  Negative.   Endocrine: Negative for cold intolerance and heat intolerance.  Genitourinary: Negative.   Musculoskeletal: Negative for gait problem and joint swelling.  Allergic/Immunologic: Negative for immunocompromised state.  Neurological: Negative for weakness and numbness.  Psychiatric/Behavioral: Negative.       Objective:    Physical Exam  Constitutional: She is oriented to person, place, and time. She appears well-developed and well-nourished. No distress.  HENT:  Head: Normocephalic and atraumatic.  Right Ear: External ear normal.  Left Ear: External ear normal.  Mouth/Throat: Uvula is midline, oropharynx is clear and moist and mucous membranes are normal. No oropharyngeal exudate.  Eyes: Pupils are equal, round, and reactive to light. Conjunctivae are normal. Right eye exhibits no discharge. Left eye exhibits no discharge. No scleral icterus.  Neck: No JVD present. No tracheal deviation present. No thyromegaly present.  Cardiovascular: Normal rate, regular rhythm and normal heart sounds.  Pulmonary/Chest: Effort normal and breath sounds normal. No stridor.  Musculoskeletal:        General: No edema.  Lymphadenopathy:    She has no cervical adenopathy.  Neurological: She is alert and oriented to person, place, and time.  Skin: Skin is warm and dry. She is not diaphoretic.  Psychiatric: She has a normal mood and affect. Her behavior is normal.    BP 100/62   Pulse 88   Temp 97.8 F (36.6 C) (Tympanic)   Ht 5\' 3"  (1.6 m)   Wt 138 lb 3.2 oz (62.7 kg)   SpO2 97%   BMI 24.48 kg/m  Wt Readings from Last 3 Encounters:  04/20/19 138 lb 3.2 oz (62.7 kg)  04/25/18 139 lb 8 oz (63.3 kg)  02/27/18 139 lb (63 kg)     Health Maintenance Due  Topic Date Due  . TETANUS/TDAP  11/02/1990  . INFLUENZA VACCINE  10/18/2018  . PAP SMEAR-Modifier  11/23/2018    There are no preventive care reminders to display for this patient.  Lab Results  Component Value Date   TSH 1.92  04/07/2018   Lab Results  Component Value Date   WBC 4.2 08/22/2016   HGB 12.5 08/22/2016   HCT 37 08/22/2016   MCV 86.4 03/05/2007   PLT 206 08/22/2016   Lab Results  Component Value Date   NA 142 08/22/2016   K 4.4 08/22/2016   GLUCOSE 91 03/05/2007   BUN 12 08/22/2016   CREATININE 1.0 08/22/2016   ALKPHOS 52 08/22/2016   AST 24 08/22/2016   ALT 14 08/22/2016   Lab Results  Component Value Date   CHOL 208 (A) 08/22/2016   Lab Results  Component Value Date   HDL 72 (A) 08/22/2016   Lab Results  Component Value Date  Davis 123 08/22/2016   Lab Results  Component Value Date   TRIG 64 08/22/2016   No results found for: CHOLHDL No results found for: HGBA1C    Assessment & Plan:   Problem List Items Addressed This Visit      Endocrine   Acquired hypothyroidism   Relevant Orders   TSH     Other   Healthcare maintenance - Primary   Relevant Orders   CBC   Comprehensive metabolic panel   Lipid panel   Urinalysis, Routine w reflex microscopic   Nasal congestion   Relevant Medications   mometasone (NASONEX) 50 MCG/ACT nasal spray   predniSONE (DELTASONE) 20 MG tablet      Meds ordered this encounter  Medications  . mometasone (NASONEX) 50 MCG/ACT nasal spray    Sig: Place 2 sprays into the nose daily.    Dispense:  17 g    Refill:  12  . predniSONE (DELTASONE) 20 MG tablet    Sig: Take 1 tablet (20 mg total) by mouth 2 (two) times daily with a meal for 7 days.    Dispense:  14 tablet    Refill:  0    Follow-up: Return return fasting for bloodwork and in a few weeks for virtual appontment if sinuses aren't improving.Libby Maw, MD   2/5 addendum: Patient's blood work shows elevation of TSH, decreased GFR and anemia.  Patient reports that pharmacy has changed thyroid formulation.  We will increase dose to 88 mcg.  Patient will concentrate on hydrating herself.  Patient will take a multivitamin with iron.  She will follow-up in 3  months.

## 2019-04-23 ENCOUNTER — Other Ambulatory Visit: Payer: Self-pay

## 2019-04-24 ENCOUNTER — Other Ambulatory Visit (INDEPENDENT_AMBULATORY_CARE_PROVIDER_SITE_OTHER): Payer: BLUE CROSS/BLUE SHIELD

## 2019-04-24 DIAGNOSIS — Z0000 Encounter for general adult medical examination without abnormal findings: Secondary | ICD-10-CM | POA: Diagnosis not present

## 2019-04-24 DIAGNOSIS — D649 Anemia, unspecified: Secondary | ICD-10-CM | POA: Insufficient documentation

## 2019-04-24 DIAGNOSIS — E039 Hypothyroidism, unspecified: Secondary | ICD-10-CM

## 2019-04-24 DIAGNOSIS — E860 Dehydration: Secondary | ICD-10-CM | POA: Insufficient documentation

## 2019-04-24 LAB — CBC
HCT: 34.1 % — ABNORMAL LOW (ref 36.0–46.0)
Hemoglobin: 11.3 g/dL — ABNORMAL LOW (ref 12.0–15.0)
MCHC: 33 g/dL (ref 30.0–36.0)
MCV: 81 fl (ref 78.0–100.0)
Platelets: 181 10*3/uL (ref 150.0–400.0)
RBC: 4.2 Mil/uL (ref 3.87–5.11)
RDW: 15.1 % (ref 11.5–15.5)
WBC: 4.2 10*3/uL (ref 4.0–10.5)

## 2019-04-24 LAB — URINALYSIS, ROUTINE W REFLEX MICROSCOPIC
Bilirubin Urine: NEGATIVE
Hgb urine dipstick: NEGATIVE
Ketones, ur: NEGATIVE
Leukocytes,Ua: NEGATIVE
Nitrite: NEGATIVE
Specific Gravity, Urine: 1.025 (ref 1.000–1.030)
Total Protein, Urine: NEGATIVE
Urine Glucose: NEGATIVE
Urobilinogen, UA: 0.2 (ref 0.0–1.0)
pH: 6 (ref 5.0–8.0)

## 2019-04-24 LAB — COMPREHENSIVE METABOLIC PANEL
ALT: 14 U/L (ref 0–35)
AST: 17 U/L (ref 0–37)
Albumin: 4 g/dL (ref 3.5–5.2)
Alkaline Phosphatase: 49 U/L (ref 39–117)
BUN: 12 mg/dL (ref 6–23)
CO2: 22 mEq/L (ref 19–32)
Calcium: 8.9 mg/dL (ref 8.4–10.5)
Chloride: 106 mEq/L (ref 96–112)
Creatinine, Ser: 1.12 mg/dL (ref 0.40–1.20)
GFR: 52.04 mL/min — ABNORMAL LOW (ref >60.00)
Glucose, Bld: 78 mg/dL (ref 70–99)
Potassium: 3.8 mEq/L (ref 3.5–5.1)
Sodium: 139 mEq/L (ref 135–145)
Total Bilirubin: 0.4 mg/dL (ref 0.2–1.2)
Total Protein: 6.6 g/dL (ref 6.0–8.3)

## 2019-04-24 LAB — LIPID PANEL
Cholesterol: 216 mg/dL — ABNORMAL HIGH (ref 0–200)
HDL: 65.8 mg/dL (ref >39.00)
LDL Cholesterol: 132 mg/dL — ABNORMAL HIGH (ref 0–99)
NonHDL: 150.51
Total CHOL/HDL Ratio: 3
Triglycerides: 95 mg/dL (ref 0.0–149.0)
VLDL: 19 mg/dL (ref 0.0–40.0)

## 2019-04-24 LAB — TSH: TSH: 6.45 u[IU]/mL — ABNORMAL HIGH (ref 0.35–4.50)

## 2019-04-24 MED ORDER — LEVOTHYROXINE SODIUM 88 MCG PO TABS: 88.0000 ug | ORAL_TABLET | Freq: Every day | ORAL | 0 refills | Status: DC

## 2019-04-24 NOTE — Addendum Note (Signed)
Addended by: Andrez Grime on: 04/24/2019 02:24 PM   Modules accepted: Orders

## 2019-05-20 DIAGNOSIS — Z6824 Body mass index (BMI) 24.0-24.9, adult: Secondary | ICD-10-CM | POA: Diagnosis not present

## 2019-05-20 DIAGNOSIS — Z01419 Encounter for gynecological examination (general) (routine) without abnormal findings: Secondary | ICD-10-CM | POA: Diagnosis not present

## 2019-05-20 DIAGNOSIS — Z1231 Encounter for screening mammogram for malignant neoplasm of breast: Secondary | ICD-10-CM | POA: Diagnosis not present

## 2019-07-28 ENCOUNTER — Other Ambulatory Visit: Payer: Self-pay

## 2019-07-28 DIAGNOSIS — E039 Hypothyroidism, unspecified: Secondary | ICD-10-CM

## 2019-07-28 MED ORDER — LEVOTHYROXINE SODIUM 88 MCG PO TABS: 88.0000 ug | ORAL_TABLET | Freq: Every day | ORAL | 0 refills | Status: DC

## 2019-09-11 ENCOUNTER — Telehealth: Payer: Self-pay | Admitting: Family Medicine

## 2019-09-11 DIAGNOSIS — E039 Hypothyroidism, unspecified: Secondary | ICD-10-CM

## 2019-09-11 NOTE — Telephone Encounter (Signed)
Patient has order for TSH she will need to be scheduled for labs only

## 2019-09-11 NOTE — Telephone Encounter (Signed)
Patient states Dr. Doreene Burke wanted her to come back in May just for labs. I do not see labs ordered. Please advise if this should be an office visit or just a lab appt.

## 2019-09-11 NOTE — Telephone Encounter (Signed)
Please advise message below  °

## 2019-09-15 ENCOUNTER — Other Ambulatory Visit: Payer: Self-pay

## 2019-09-17 ENCOUNTER — Other Ambulatory Visit (INDEPENDENT_AMBULATORY_CARE_PROVIDER_SITE_OTHER): Payer: BLUE CROSS/BLUE SHIELD

## 2019-09-17 DIAGNOSIS — E039 Hypothyroidism, unspecified: Secondary | ICD-10-CM

## 2019-09-17 LAB — TSH: TSH: 0.57 u[IU]/mL (ref 0.35–4.50)

## 2019-09-18 MED ORDER — LEVOTHYROXINE SODIUM 88 MCG PO TABS: 88.0000 ug | ORAL_TABLET | Freq: Every day | ORAL | 2 refills | Status: DC

## 2019-09-18 NOTE — Addendum Note (Signed)
Addended by: Andrez Grime on: 09/18/2019 08:04 AM   Modules accepted: Orders

## 2019-12-19 DIAGNOSIS — Z20822 Contact with and (suspected) exposure to covid-19: Secondary | ICD-10-CM | POA: Diagnosis not present

## 2020-02-14 IMAGING — MR MR LUMBAR SPINE W/O CM
4 of 5 series · 26 of 48 positions shown · non-contrast
Comparison: None.

CLINICAL DATA: Back pain radiating down the right leg

EXAM:
MRI LUMBAR SPINE WITHOUT CONTRAST
TECHNIQUE: Multiplanar, multisequence MR imaging of the lumbar spine was
performed. No intravenous contrast was administered.

[Series 2: T1 · sagittal · 4.0mm · 0.81mm/px · 6 of 14 slices shown (1 of 2)]
[im 1/14]
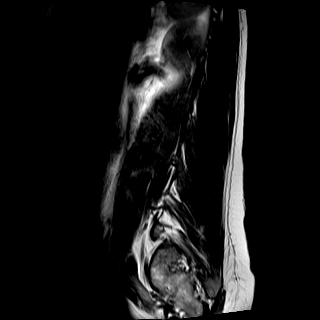
[im 3/14]
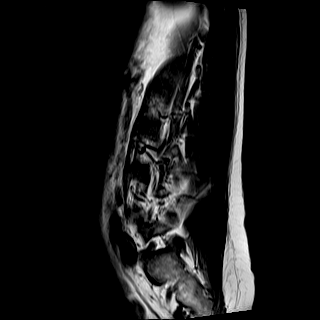
[im 6/14]
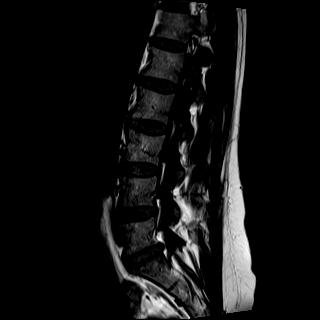
[im 8/14]
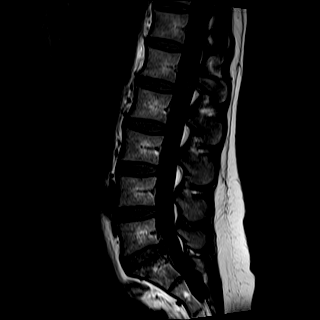
[im 11/14]
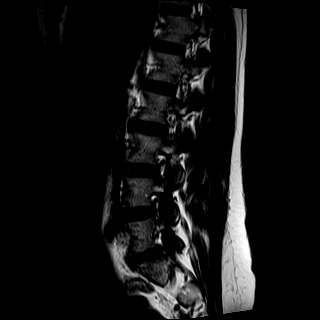
[im 14/14]
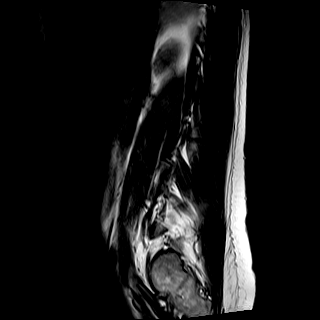

[Series 3: T2 · sagittal · 4.0mm · 0.81mm/px · 6 of 14 slices shown (1 of 2)]
[im 1/14]
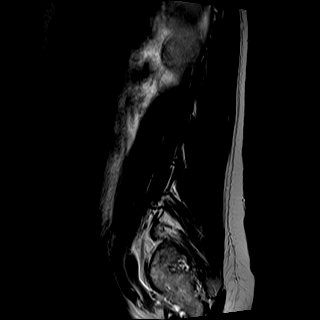
[im 3/14]
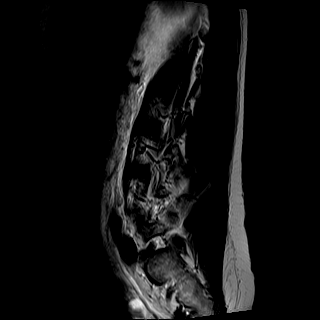
[im 6/14]
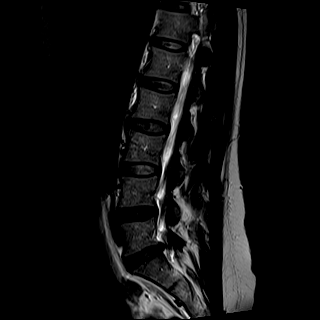
[im 8/14]
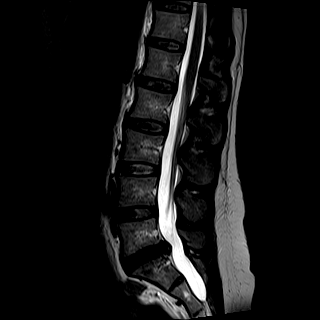
[im 11/14]
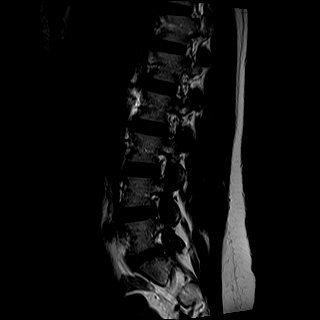
[im 14/14]
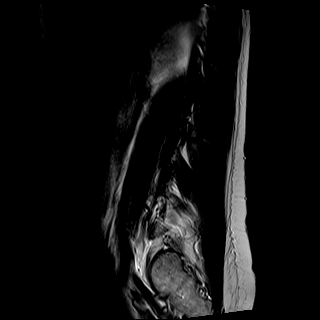

[Series 5: T2 · axial · 4.0mm · 0.39mm/px · z∈[-111,+73]mm · 9 of 38 slices shown (2 of 2)]
[im 1/38]
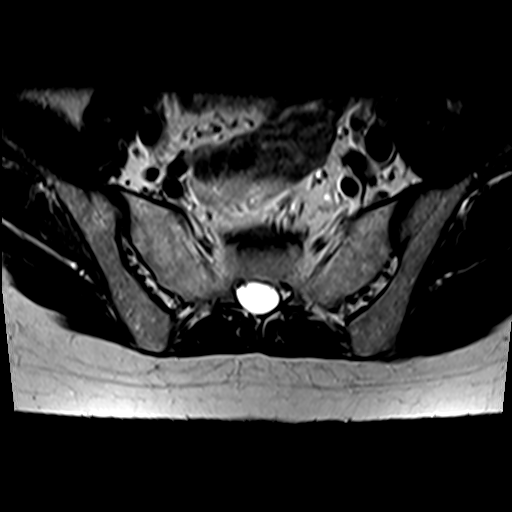
[im 6/38]
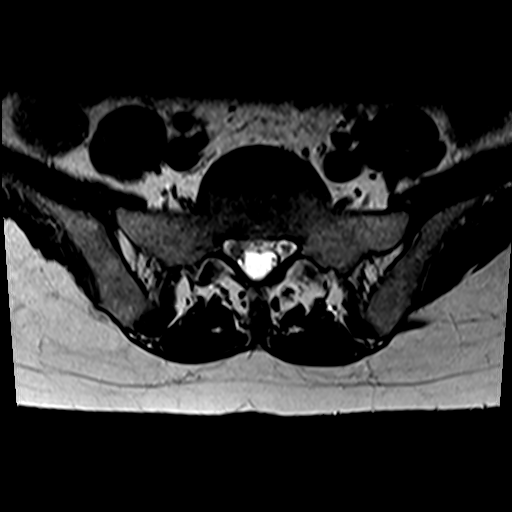
[im 11/38]
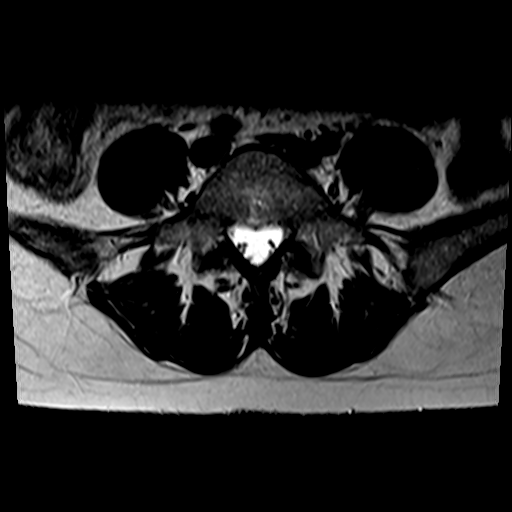
[im 16/38]
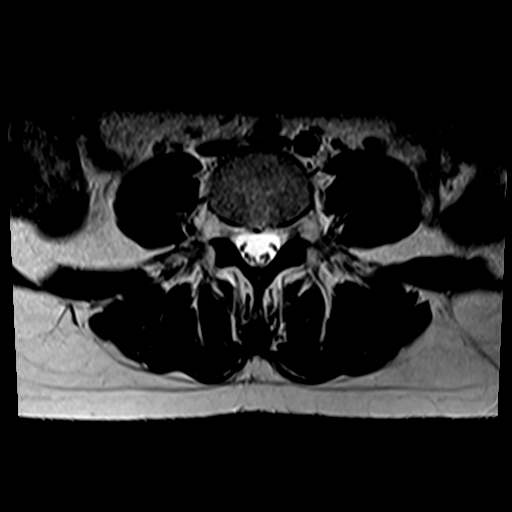
[im 19/38]
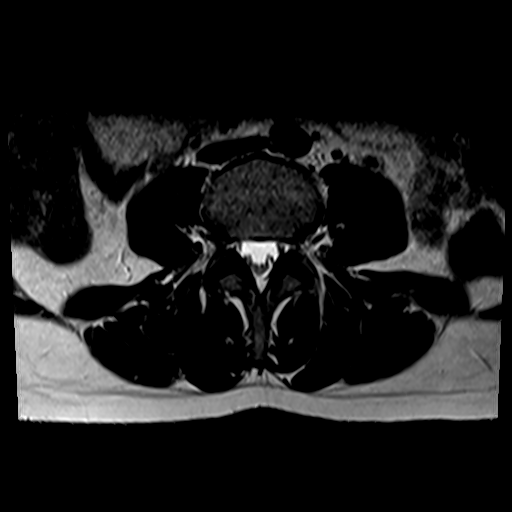
[im 22/38]
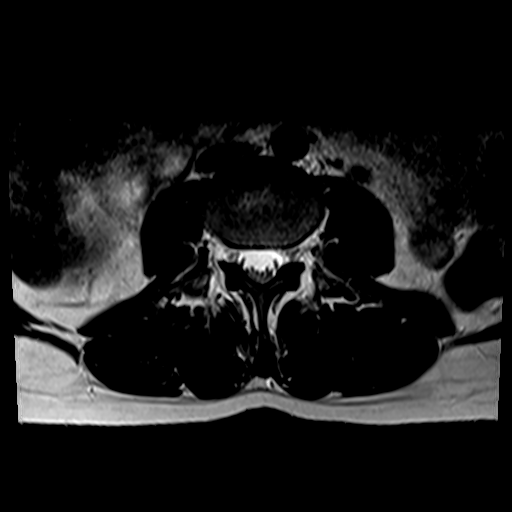
[im 27/38]
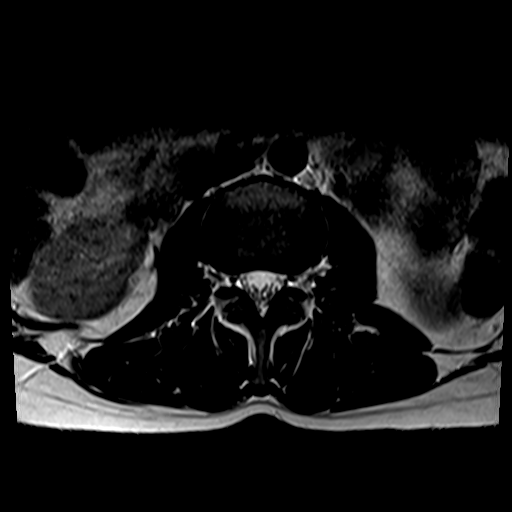
[im 32/38]
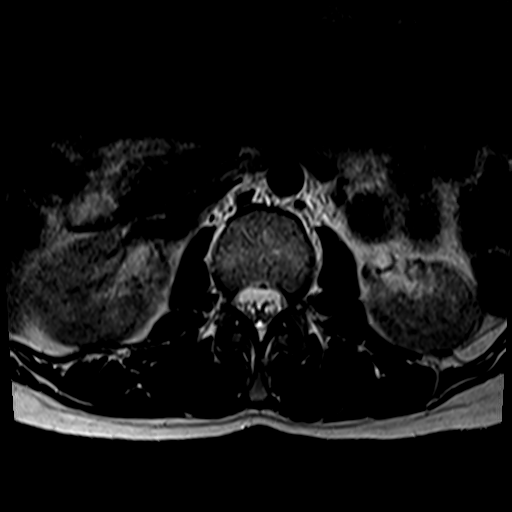
[im 38/38]
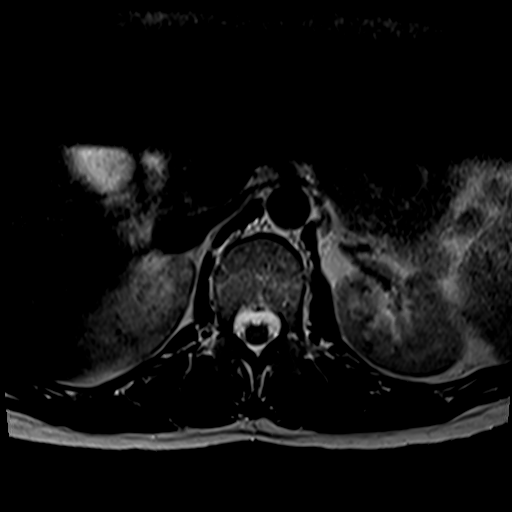

[Series 6: T1 · axial · 4.0mm · 0.39mm/px · z∈[-111,+43]mm · 5 of 38 slices shown (2 of 2)]
[im 1/38]
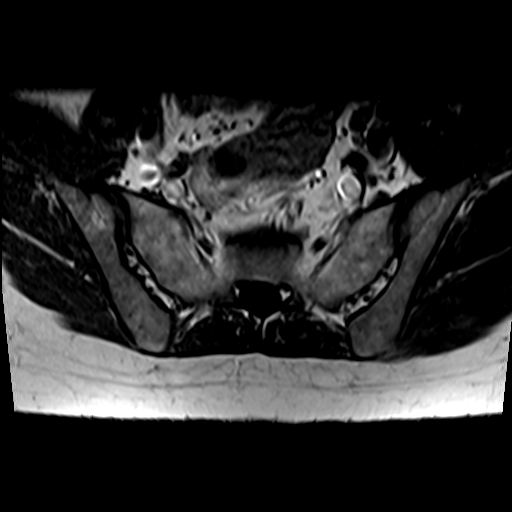
[im 6/38]
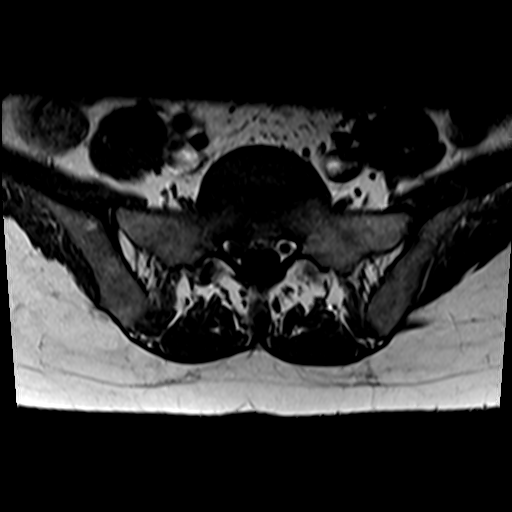
[im 11/38]
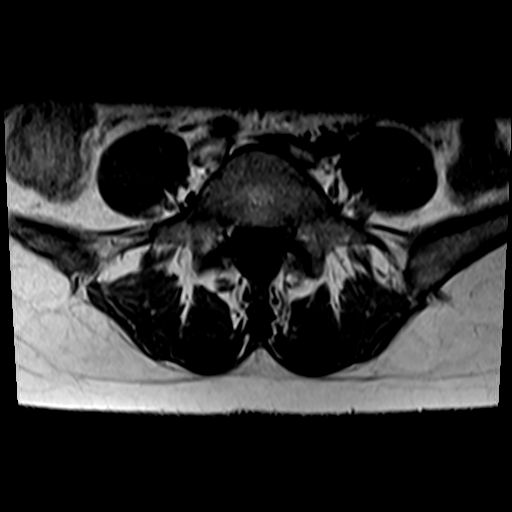
[im 19/38]
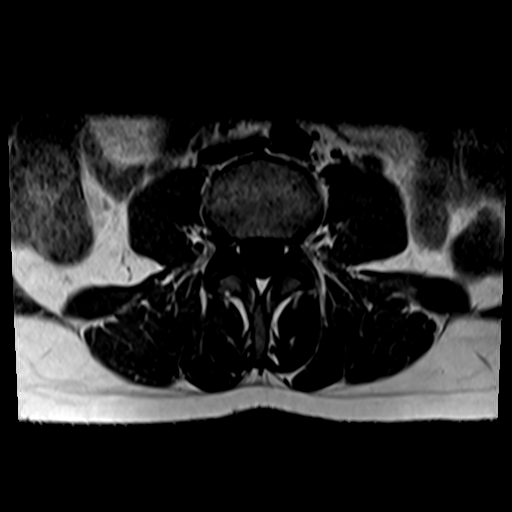
[im 32/38]
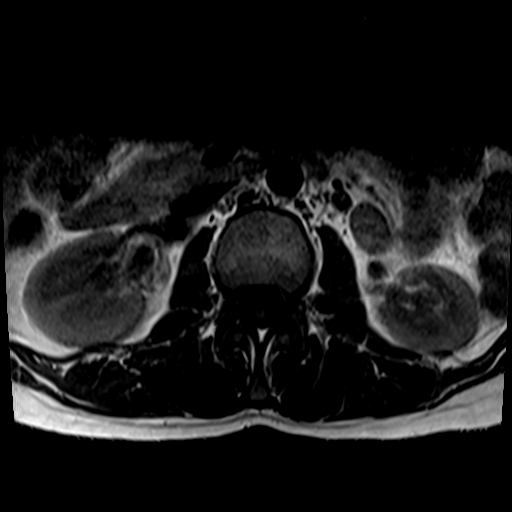

[26 of 48 positions shown; findings below may reference images not displayed]

FINDINGS: Segmentation: Standard lumbar numbering based on the available
coverage

Alignment:  Physiologic.

Vertebrae:  No fracture, evidence of discitis, or bone lesion.

Conus medullaris and cauda equina: Conus extends to the L2 level.
Conus and cauda equina appear normal.

Paraspinal and other soft tissues: Negative

Disc levels:

L5-S1 disc narrowing and desiccation with right paracentral
extrusion compressing the right S1 nerve root. There is a parent
protrusion which crosses midline and contacts the left S1 nerve
root.
IMPRESSION: L5-S1 right paracentral herniation with S1 compression. The
herniation crosses midline and also contacts the left S1 nerve root.

## 2020-04-07 DIAGNOSIS — Z1152 Encounter for screening for COVID-19: Secondary | ICD-10-CM | POA: Diagnosis not present

## 2020-04-27 ENCOUNTER — Other Ambulatory Visit: Payer: BLUE CROSS/BLUE SHIELD

## 2020-04-28 DIAGNOSIS — Z20822 Contact with and (suspected) exposure to covid-19: Secondary | ICD-10-CM | POA: Diagnosis not present

## 2020-04-28 DIAGNOSIS — Z03818 Encounter for observation for suspected exposure to other biological agents ruled out: Secondary | ICD-10-CM | POA: Diagnosis not present

## 2020-06-08 DIAGNOSIS — Z6824 Body mass index (BMI) 24.0-24.9, adult: Secondary | ICD-10-CM | POA: Diagnosis not present

## 2020-06-08 DIAGNOSIS — Z01419 Encounter for gynecological examination (general) (routine) without abnormal findings: Secondary | ICD-10-CM | POA: Diagnosis not present

## 2020-06-08 DIAGNOSIS — Z1231 Encounter for screening mammogram for malignant neoplasm of breast: Secondary | ICD-10-CM | POA: Diagnosis not present

## 2020-06-08 DIAGNOSIS — N951 Menopausal and female climacteric states: Secondary | ICD-10-CM | POA: Diagnosis not present

## 2020-06-13 DIAGNOSIS — R1031 Right lower quadrant pain: Secondary | ICD-10-CM | POA: Diagnosis not present

## 2020-07-29 ENCOUNTER — Other Ambulatory Visit: Payer: Self-pay

## 2020-07-29 DIAGNOSIS — E039 Hypothyroidism, unspecified: Secondary | ICD-10-CM

## 2020-07-29 MED ORDER — LEVOTHYROXINE SODIUM 88 MCG PO TABS: 88.0000 ug | ORAL_TABLET | Freq: Every day | ORAL | 0 refills | Status: DC

## 2020-09-08 ENCOUNTER — Other Ambulatory Visit: Payer: Self-pay | Admitting: Family Medicine

## 2020-09-08 DIAGNOSIS — E039 Hypothyroidism, unspecified: Secondary | ICD-10-CM

## 2020-09-08 MED ORDER — LEVOTHYROXINE SODIUM 88 MCG PO TABS: 88.0000 ug | ORAL_TABLET | Freq: Every day | ORAL | 1 refills | Status: DC

## 2020-09-08 NOTE — Telephone Encounter (Signed)
Patient states that she is unable to come in and do labs because she is out of town and will not return until the Fall. She is completely out of her Levothyroxine and wants to know if it can be called in. If approved, [facility] [place], [place] and call her at [phone] to let her know that it has been sent in.

## 2020-09-08 NOTE — Telephone Encounter (Signed)
Please advise message below, patient calling for refill on pending medication states that she is out of town will not be back until the fall last refill 07/30/19 30 day supply last OV 04/22/2019.

## 2020-09-09 ENCOUNTER — Other Ambulatory Visit: Payer: Self-pay

## 2020-09-09 DIAGNOSIS — E039 Hypothyroidism, unspecified: Secondary | ICD-10-CM

## 2020-09-09 MED ORDER — LEVOTHYROXINE SODIUM 88 MCG PO TABS: 88.0000 ug | ORAL_TABLET | Freq: Every day | ORAL | 1 refills | Status: DC

## 2020-09-09 NOTE — Telephone Encounter (Signed)
Rx sent to requested pharmacy patient aware.  ?

## 2020-11-18 ENCOUNTER — Telehealth: Payer: Self-pay | Admitting: Family Medicine

## 2020-11-18 DIAGNOSIS — E039 Hypothyroidism, unspecified: Secondary | ICD-10-CM

## 2020-12-06 NOTE — Telephone Encounter (Signed)
Pt said she spoke with someone about needing to come in for blood work, she also needs her Rx refilled until the end of the year, as she will be out of the country. I see Dr Doreene Burke says she needs OV.  Pt has a window of time she is back in the country, may I snag one of the "same day" visits ? She is here 9/24-10/3, please advise.   Best CB:[phone]

## 2020-12-12 ENCOUNTER — Other Ambulatory Visit: Payer: Self-pay

## 2020-12-12 ENCOUNTER — Encounter: Payer: Self-pay | Admitting: Family Medicine

## 2020-12-12 ENCOUNTER — Ambulatory Visit (INDEPENDENT_AMBULATORY_CARE_PROVIDER_SITE_OTHER): Payer: Self-pay | Admitting: Family Medicine

## 2020-12-12 VITALS — BP 122/72 | HR 61 | Temp 98.0°F | Resp 18 | Wt 132.8 lb

## 2020-12-12 DIAGNOSIS — D649 Anemia, unspecified: Secondary | ICD-10-CM

## 2020-12-12 DIAGNOSIS — E039 Hypothyroidism, unspecified: Secondary | ICD-10-CM

## 2020-12-12 NOTE — Progress Notes (Addendum)
Established Patient Office Visit  Subjective:  Patient ID: Erica Wallace, female    DOB: [birth year]  Age: 50 y.o. MRN: [id number]  CC:  Chief Complaint  Patient presents with   Medication Refill    Med check/Refill, pt states she is tolerating medications well. Pt declines flu vaccine today    HPI Erica Wallace presents for follow-up of hypothyroidism and microcytic anemia.  Lost to follow-up since July of this past year.  She has found herself however in [place] running at home or [place] refugees.  She is home briefly this week and then heading back to [place].  Levothyroxine increased to 88 mcg last visit.  Hemoglobin was slightly depressed and microcytic.  She tells me that she had been bleeding heavily at that time but that is since stopped.  Menses are becoming more irregular and spaced further apart.  Denies melena or hematochezia.  No hematuria.  She will remain in [place] at least through the end of the year.  Past Medical History:  Diagnosis Date   Thyroid disease     History reviewed. No pertinent surgical history.  Family History  Problem Relation Age of Onset   Hypertension Father    Stroke Maternal Grandfather    Cancer Maternal Grandmother    Hyperlipidemia Maternal Grandmother    Hypothyroidism Mother    Diabetes Paternal Grandfather    Heart attack Paternal Grandfather    Alzheimer's disease Paternal Grandmother     Social History   Socioeconomic History   Marital status: Married    Spouse name: Not on file   Number of children: Not on file   Years of education: Not on file   Highest education level: Not on file  Occupational History   Not on file  Tobacco Use   Smoking status: Never   Smokeless tobacco: Never  Substance and Sexual Activity   Alcohol use: Yes   Drug use: No   Sexual activity: Yes    Partners: Male  Other Topics Concern   Not on file  Social History Narrative   Not on file   Social Determinants of Health   Financial  Resource Strain: Not on file  Food Insecurity: Not on file  Transportation Needs: Not on file  Physical Activity: Not on file  Stress: Not on file  Social Connections: Not on file  Intimate Partner Violence: Not on file    Outpatient Medications Prior to Visit  Medication Sig Dispense Refill   levothyroxine (SYNTHROID) 88 MCG tablet TAKE 1 TABLET BY MOUTH DAILY BEFORE BREAKFAST. 30 tablet 1   mometasone (NASONEX) 50 MCG/ACT nasal spray Place 2 sprays into the nose daily. (Patient not taking: Reported on 12/12/2020) 17 g 12   traMADol (ULTRAM) 50 MG tablet Take 1 tablet (50 mg total) by mouth every 6 (six) hours as needed. (Patient not taking: No sig reported) 40 tablet 0   No facility-administered medications prior to visit.    No Known Allergies  ROS Review of Systems  Constitutional:  Negative for chills, diaphoresis, fatigue, fever and unexpected weight change.  HENT: Negative.    Eyes:  Negative for photophobia and visual disturbance.  Respiratory: Negative.    Cardiovascular: Negative.   Gastrointestinal: Negative.  Negative for abdominal pain, anal bleeding and blood in stool.  Endocrine: Negative for cold intolerance and heat intolerance.  Genitourinary:  Negative for frequency, hematuria and vaginal bleeding.  Musculoskeletal:  Negative for gait problem and joint swelling.  Neurological:  Negative for light-headedness.  A/V    Objective:    Physical Exam  BP 122/72 (BP Location: Left Arm, Patient Position: Sitting, Cuff Size: Normal)   Pulse 61   Temp 98 F (36.7 C) (Temporal)   Resp 18   Wt 132 lb 12.8 oz (60.2 kg)   SpO2 97%   BMI 23.52 kg/m  Wt Readings from Last 3 Encounters:  12/12/20 132 lb 12.8 oz (60.2 kg)  04/20/19 138 lb 3.2 oz (62.7 kg)  04/25/18 139 lb 8 oz (63.3 kg)     Health Maintenance Due  Topic Date Due   COVID-19 Vaccine (1) Never done   Hepatitis C Screening  Never done   COLONOSCOPY (Pts 45-1yrs Insurance coverage will need to be  confirmed)  Never done   INFLUENZA VACCINE  10/17/2020    There are no preventive care reminders to display for this patient.  Lab Results  Component Value Date   TSH 0.33 (L) 12/12/2020   Lab Results  Component Value Date   WBC 5.0 12/12/2020   HGB 12.5 12/12/2020   HCT 37.3 12/12/2020   MCV 85.5 12/12/2020   PLT 227.0 12/12/2020   Lab Results  Component Value Date   NA 140 12/12/2020   K 3.7 12/12/2020   CO2 26 12/12/2020   GLUCOSE 91 12/12/2020   BUN 14 12/12/2020   CREATININE 1.06 12/12/2020   BILITOT 0.4 04/24/2019   ALKPHOS 49 04/24/2019   AST 17 04/24/2019   ALT 14 04/24/2019   PROT 6.6 04/24/2019   ALBUMIN 4.0 04/24/2019   CALCIUM 9.4 12/12/2020   GFR 61.86 12/12/2020   Lab Results  Component Value Date   CHOL 216 (H) 04/24/2019   Lab Results  Component Value Date   HDL 65.80 04/24/2019   Lab Results  Component Value Date   LDLCALC 132 (H) 04/24/2019   Lab Results  Component Value Date   TRIG 95.0 04/24/2019   Lab Results  Component Value Date   CHOLHDL 3 04/24/2019   No results found for: HGBA1C    Assessment & Plan:   Problem List Items Addressed This Visit       Endocrine   Acquired hypothyroidism - Primary   Relevant Medications   levothyroxine (SYNTHROID) 75 MCG tablet   Other Relevant Orders   TSH (Completed)     Other   Anemia   Relevant Orders   CBC (Completed)   Iron, TIBC and Ferritin Panel (Completed)   Basic metabolic panel (Completed)   B12 and Folate Panel (Completed)   Urinalysis, Routine w reflex microscopic (Completed)    Meds ordered this encounter  Medications   levothyroxine (SYNTHROID) 75 MCG tablet    Sig: Take 1 tablet (75 mcg total) by mouth daily before breakfast.    Dispense:  120 tablet    Refill:  1     Follow-up: Return in about 6 months (around 06/11/2021), or if symptoms worsen or fail to improve.  Needs levothyroxine through January.  Hopefully anemia will be resolved.    Mliss Sax, MD  927 addendum: TSH is over suppressed on 88 mcg dose.  We will decrease levothyroxine to 75 mcg.  Advised patient to have TSH rechecked in 2 months.  She may need to follow-up with a physician in [place].

## 2020-12-13 LAB — URINALYSIS, ROUTINE W REFLEX MICROSCOPIC
Bilirubin Urine: NEGATIVE
Hgb urine dipstick: NEGATIVE
Ketones, ur: NEGATIVE
Leukocytes,Ua: NEGATIVE
Nitrite: NEGATIVE
RBC / HPF: NONE SEEN (ref 0–?)
Specific Gravity, Urine: 1.01 (ref 1.000–1.030)
Total Protein, Urine: NEGATIVE
Urine Glucose: NEGATIVE
Urobilinogen, UA: 0.2 (ref 0.0–1.0)
pH: 7 (ref 5.0–8.0)

## 2020-12-13 LAB — IRON,TIBC AND FERRITIN PANEL
%SAT: 14 % (calc) — ABNORMAL LOW (ref 16–45)
Ferritin: 22 ng/mL (ref 16–232)
Iron: 39 ug/dL — ABNORMAL LOW (ref 40–190)
TIBC: 287 mcg/dL (calc) (ref 250–450)

## 2020-12-13 LAB — BASIC METABOLIC PANEL
BUN: 14 mg/dL (ref 6–23)
CO2: 26 mEq/L (ref 19–32)
Calcium: 9.4 mg/dL (ref 8.4–10.5)
Chloride: 106 mEq/L (ref 96–112)
Creatinine, Ser: 1.06 mg/dL (ref 0.40–1.20)
GFR: 61.86 mL/min (ref >60.00)
Glucose, Bld: 91 mg/dL (ref 70–99)
Potassium: 3.7 mEq/L (ref 3.5–5.1)
Sodium: 140 mEq/L (ref 135–145)

## 2020-12-13 LAB — CBC
HCT: 37.3 % (ref 36.0–46.0)
Hemoglobin: 12.5 g/dL (ref 12.0–15.0)
MCHC: 33.6 g/dL (ref 30.0–36.0)
MCV: 85.5 fl (ref 78.0–100.0)
Platelets: 227 10*3/uL (ref 150.0–400.0)
RBC: 4.36 Mil/uL (ref 3.87–5.11)
RDW: 13 % (ref 11.5–15.5)
WBC: 5 10*3/uL (ref 4.0–10.5)

## 2020-12-13 LAB — TSH: TSH: 0.33 u[IU]/mL — ABNORMAL LOW (ref 0.35–5.50)

## 2020-12-13 LAB — B12 AND FOLATE PANEL
Folate: >23.4 ng/mL (ref >5.9)
Vitamin B-12: 455 pg/mL (ref 211–911)

## 2020-12-13 MED ORDER — LEVOTHYROXINE SODIUM 75 MCG PO TABS: 75.0000 ug | ORAL_TABLET | Freq: Every day | ORAL | 1 refills | Status: DC

## 2020-12-13 NOTE — Addendum Note (Signed)
Addended by: Andrez Grime on: 12/13/2020 04:54 PM   Modules accepted: Orders

## 2021-05-10 ENCOUNTER — Other Ambulatory Visit: Payer: Self-pay | Admitting: Family Medicine

## 2021-08-28 ENCOUNTER — Other Ambulatory Visit: Payer: Self-pay | Admitting: Family Medicine

## 2021-12-09 ENCOUNTER — Other Ambulatory Visit: Payer: Self-pay | Admitting: Family Medicine

## 2022-01-25 ENCOUNTER — Ambulatory Visit (INDEPENDENT_AMBULATORY_CARE_PROVIDER_SITE_OTHER): Payer: No Typology Code available for payment source | Admitting: Family Medicine

## 2022-01-25 ENCOUNTER — Encounter: Payer: Self-pay | Admitting: Family Medicine

## 2022-01-25 VITALS — BP 122/78 | HR 77 | Temp 97.3°F | Ht 63.0 in | Wt 126.4 lb

## 2022-01-25 DIAGNOSIS — H0011 Chalazion right upper eyelid: Secondary | ICD-10-CM | POA: Diagnosis not present

## 2022-01-25 NOTE — Progress Notes (Signed)
Assessment/Plan:   Problem List Items Addressed This Visit   None Visit Diagnoses     Chalazion of right upper eyelid    -  Primary   Relevant Orders   Ambulatory referral to Ophthalmology      Symptoms most consistent with Chalazion.  Continue warm compresses.  Recommend referral to ophthalmology.  ED and return precaution discussed    Subjective:  HPI:  Erica Wallace is a 51 y.o. female who has Acquired hypothyroidism; Healthcare maintenance; Strain of lumbar region; Lumbar radiculopathy; Herniation of intervertebral disc between L5 and S1; Nasal congestion; Anemia; and Dehydration on their problem list..   She  has a past medical history of Thyroid disease..   She presents with chief complaint of Knot on eyelid (Right eyelid knot x 10 days. ) .  Eyelid lesion.  Patient presents for right upper eyelid lesion.  It has been growing for the past 10 days.  It is not tender.  There is no eye pain or blurry vision.  Patient denies fevers, chills, rash, purulent drainage.  Patient not had history of this before.  Patient has been using warm compresses without improvement. No past surgical history on file.  Outpatient Medications Prior to Visit  Medication Sig Dispense Refill   levothyroxine (SYNTHROID) 75 MCG tablet TAKE 1 TABLET BY MOUTH EVERY DAY BEFORE BREAKFAST *MUST FILL AT [facility] 30 tablet 2   No facility-administered medications prior to visit.    Family History  Problem Relation Age of Onset   Hypertension Father    Stroke Maternal Grandfather    Cancer Maternal Grandmother    Hyperlipidemia Maternal Grandmother    Hypothyroidism Mother    Diabetes Paternal Grandfather    Heart attack Paternal Grandfather    Alzheimer's disease Paternal Grandmother     Social History   Socioeconomic History   Marital status: Married    Spouse name: Not on file   Number of children: Not on file   Years of education: Not on file   Highest education level: Not on  file  Occupational History   Not on file  Tobacco Use   Smoking status: Never   Smokeless tobacco: Never  Substance and Sexual Activity   Alcohol use: Yes   Drug use: No   Sexual activity: Yes    Partners: Male  Other Topics Concern   Not on file  Social History Narrative   Not on file   Social Determinants of Health   Financial Resource Strain: Not on file  Food Insecurity: Not on file  Transportation Needs: Not on file  Physical Activity: Not on file  Stress: Not on file  Social Connections: Not on file  Intimate Partner Violence: Not on file                                                                                                 Objective:  Physical Exam: BP 122/78 (BP Location: Left Arm, Patient Position: Sitting, Cuff Size: Large)   Pulse 77   Temp (!) 97.3 F (36.3 C) (Temporal)   Ht 5\' 3"  (1.6  m)   Wt 126 lb 6.4 oz (57.3 kg)   SpO2 97%   BMI 22.39 kg/m    General: No acute distress. Awake and conversant.  Eyes: Normal conjunctiva, anicteric. Round symmetric pupils.  Right upper eyelid swelling, nontender, less than 1 cm in diameter, inversion of eyelid did not show any lesions, there is no visible lesion on the cornea or sclera ENT: Hearing grossly intact. No nasal discharge.  Neck: Neck is supple. No masses or thyromegaly.  Respiratory: Respirations are non-labored. No auditory wheezing.  Skin: Warm. No rashes or ulcers.  Psych: Alert and oriented. Cooperative, Appropriate mood and affect, Normal judgment.  CV: No cyanosis or JVD MSK: Normal ambulation. No clubbing  Neuro: Sensation and CN II-XII grossly normal.        Garner Nash, MD, MS

## 2022-01-25 NOTE — Patient Instructions (Signed)
Continue warm compresses. We referred to ophthalmology.

## 2022-02-05 ENCOUNTER — Other Ambulatory Visit: Payer: Self-pay | Admitting: Family Medicine

## 2022-04-13 ENCOUNTER — Other Ambulatory Visit: Payer: Self-pay | Admitting: Family Medicine

## 2022-04-13 NOTE — Telephone Encounter (Signed)
Lft VM to rtn call. Dm/cma  

## 2022-04-18 ENCOUNTER — Ambulatory Visit (INDEPENDENT_AMBULATORY_CARE_PROVIDER_SITE_OTHER): Payer: 59 | Admitting: Family Medicine

## 2022-04-18 ENCOUNTER — Encounter: Payer: Self-pay | Admitting: Family Medicine

## 2022-04-18 VITALS — BP 115/68 | HR 61 | Temp 97.6°F | Ht 63.0 in | Wt 127.2 lb

## 2022-04-18 DIAGNOSIS — E039 Hypothyroidism, unspecified: Secondary | ICD-10-CM | POA: Diagnosis not present

## 2022-04-18 DIAGNOSIS — Z0000 Encounter for general adult medical examination without abnormal findings: Secondary | ICD-10-CM | POA: Diagnosis not present

## 2022-04-18 LAB — URINALYSIS, ROUTINE W REFLEX MICROSCOPIC
Ketones, ur: NEGATIVE
Leukocytes,Ua: NEGATIVE
Nitrite: NEGATIVE
Specific Gravity, Urine: 1.025 (ref 1.000–1.030)
Total Protein, Urine: NEGATIVE
Urine Glucose: NEGATIVE
Urobilinogen, UA: 0.2 (ref 0.0–1.0)
pH: 5.5 (ref 5.0–8.0)

## 2022-04-18 LAB — LIPID PANEL
Cholesterol: 257 mg/dL — ABNORMAL HIGH (ref 0–200)
HDL: 74.4 mg/dL (ref >39.00)
LDL Cholesterol: 168 mg/dL — ABNORMAL HIGH (ref 0–99)
NonHDL: 182.32
Total CHOL/HDL Ratio: 3
Triglycerides: 73 mg/dL (ref 0.0–149.0)
VLDL: 14.6 mg/dL (ref 0.0–40.0)

## 2022-04-18 LAB — COMPREHENSIVE METABOLIC PANEL
ALT: 14 U/L (ref 0–35)
AST: 19 U/L (ref 0–37)
Albumin: 4.5 g/dL (ref 3.5–5.2)
Alkaline Phosphatase: 54 U/L (ref 39–117)
BUN: 12 mg/dL (ref 6–23)
CO2: 28 mEq/L (ref 19–32)
Calcium: 9.6 mg/dL (ref 8.4–10.5)
Chloride: 105 mEq/L (ref 96–112)
Creatinine, Ser: 0.95 mg/dL (ref 0.40–1.20)
GFR: 69.88 mL/min (ref >60.00)
Glucose, Bld: 76 mg/dL (ref 70–99)
Potassium: 3.9 mEq/L (ref 3.5–5.1)
Sodium: 141 mEq/L (ref 135–145)
Total Bilirubin: 0.7 mg/dL (ref 0.2–1.2)
Total Protein: 6.8 g/dL (ref 6.0–8.3)

## 2022-04-18 LAB — CBC
HCT: 39.4 % (ref 36.0–46.0)
Hemoglobin: 13.6 g/dL (ref 12.0–15.0)
MCHC: 34.5 g/dL (ref 30.0–36.0)
MCV: 85.9 fl (ref 78.0–100.0)
Platelets: 212 10*3/uL (ref 150.0–400.0)
RBC: 4.58 Mil/uL (ref 3.87–5.11)
RDW: 12.7 % (ref 11.5–15.5)
WBC: 3.5 10*3/uL — ABNORMAL LOW (ref 4.0–10.5)

## 2022-04-18 LAB — TSH: TSH: 1.48 u[IU]/mL (ref 0.35–5.50)

## 2022-04-18 NOTE — Progress Notes (Signed)
Established Patient Office Visit   Subjective:  Patient ID: Erica Wallace, female    DOB: [birth year]  Age: 52 y.o. MRN: [id number]  Chief Complaint  Patient presents with   Follow-up    Follow up on labs, no concerns. Patient fasting.     HPI Encounter Diagnoses  Name Primary?   Acquired hypothyroidism Yes   Healthcare maintenance    Doing well.  Mostly back from overseas work in [employer]  She is active physically most days of the week.  She has regular dental care.  Regular follow-up with GYN.  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  She is contemplating going back to school for masters degree in [place] for [place] counseling.  Back is okay.  She is dealing with TMJ.   Review of Systems  Constitutional: Negative.   HENT: Negative.    Eyes:  Negative for blurred vision, discharge and redness.  Respiratory: Negative.    Cardiovascular: Negative.   Gastrointestinal:  Negative for abdominal pain.  Genitourinary: Negative.   Musculoskeletal:  Positive for joint pain. Negative for myalgias.  Skin:  Negative for rash.  Neurological:  Negative for tingling, loss of consciousness and weakness.  Endo/Heme/Allergies:  Negative for polydipsia.      04/18/2022    9:47 AM 04/20/2019    1:38 PM  Depression screen PHQ 2/9  Decreased Interest 0 0  Down, Depressed, Hopeless 0 0  PHQ - 2 Score 0 0       Current Outpatient Medications:    levothyroxine (SYNTHROID) 75 MCG tablet, TAKE 1 TABLET BY MOUTH EVERY DAY BEFORE BREAKFAST *MUST FILL AT [facility], Disp: 90 tablet, Rfl: 1   Objective:     BP 115/68 (BP Location: Left Arm, Patient Position: Sitting, Cuff Size: Normal)   Pulse 61   Temp 97.6 F (36.4 C) (Temporal)   Ht 5\' 3"  (1.6 m)   Wt 127 lb 3.2 oz (57.7 kg)   SpO2 100%   BMI 22.53 kg/m    Physical Exam Constitutional:      General: She is not in acute distress.    Appearance: Normal  appearance. She is not ill-appearing, toxic-appearing or diaphoretic.  HENT:     Head: Normocephalic and atraumatic.     Right Ear: External ear normal.     Left Ear: External ear normal.     Mouth/Throat:     Mouth: Mucous membranes are moist.     Pharynx: Oropharynx is clear. No oropharyngeal exudate or posterior oropharyngeal erythema.  Eyes:     General: No scleral icterus.       Right eye: No discharge.        Left eye: No discharge.     Extraocular Movements: Extraocular movements intact.     Conjunctiva/sclera: Conjunctivae normal.     Pupils: Pupils are equal, round, and reactive to light.  Cardiovascular:     Rate and Rhythm: Normal rate and regular rhythm.  Pulmonary:     Effort: Pulmonary effort is normal. No respiratory distress.     Breath sounds: Normal breath sounds.  Abdominal:     General: Bowel sounds are normal.  Musculoskeletal:     Cervical back: No rigidity or tenderness.  Lymphadenopathy:     Cervical: No cervical adenopathy.  Skin:    General: Skin is warm and dry.  Neurological:     Mental Status: She is alert and oriented to person, place, and  time.  Psychiatric:        Mood and Affect: Mood normal.        Behavior: Behavior normal.      No results found for any visits on 04/18/22.    The 10-year ASCVD risk score (Arnett DK, et al., 2019) is: 0.9%    Assessment & Plan:   Acquired hypothyroidism -     TSH  Healthcare maintenance -     CBC -     Comprehensive metabolic panel -     Lipid panel -     Urinalysis, Routine w reflex microscopic    Return in about 1 year (around 04/19/2023), or if symptoms worsen or fail to improve.  Continue healthy and active lifestyle.  Information given on health maintenance and disease prevention.  Libby Maw, MD

## 2022-07-11 LAB — HM PAP SMEAR

## 2022-07-12 ENCOUNTER — Encounter: Payer: Self-pay | Admitting: Family Medicine

## 2022-07-12 DIAGNOSIS — Z01419 Encounter for gynecological examination (general) (routine) without abnormal findings: Secondary | ICD-10-CM | POA: Diagnosis not present

## 2022-07-12 DIAGNOSIS — Z6822 Body mass index (BMI) 22.0-22.9, adult: Secondary | ICD-10-CM | POA: Diagnosis not present

## 2022-07-12 DIAGNOSIS — Z1231 Encounter for screening mammogram for malignant neoplasm of breast: Secondary | ICD-10-CM | POA: Diagnosis not present

## 2022-10-17 ENCOUNTER — Other Ambulatory Visit: Payer: Self-pay | Admitting: Family Medicine

## 2023-04-22 ENCOUNTER — Other Ambulatory Visit: Payer: Self-pay | Admitting: Family Medicine

## 2023-04-23 ENCOUNTER — Other Ambulatory Visit: Payer: Self-pay | Admitting: Family Medicine

## 2023-04-23 NOTE — Telephone Encounter (Signed)
 Copied from CRM [id number]. Topic: Clinical - Medication Refill >> Apr 23, 2023  8:07 AM Burnard DEL wrote: Most Recent Primary Care Visit:  Provider: BERNETA ELSIE SAYRE  Department: [facility]  Visit Type: OFFICE VISIT  Date: 04/18/2022  Medication: levothyroxine  (SYNTHROID ) 75 MCG tablet  Has the patient contacted their pharmacy? No (Agent: If no, request that the patient contact the pharmacy for the refill. If patient does not wish to contact the pharmacy document the reason why and proceed with request.) (Agent: If yes, when and what did the pharmacy advise?)  Is this the correct pharmacy for this prescription? Yes If no, delete pharmacy and type the correct one.  This is the patient's preferred pharmacy:  [id number] [place], [place] - [address] [place] [address] Phone: [phone] Fax: [phone]   Has the prescription been filled recently? No  Is the patient out of the medication? Yes  Has the patient been seen for an appointment in the last year OR does the patient have an upcoming appointment? No  Can we respond through MyChart? Yes  Agent: Please be advised that Rx refills may take up to 3 business days. We ask that you follow-up with your pharmacy.

## 2023-04-24 ENCOUNTER — Telehealth: Payer: Self-pay

## 2023-04-24 MED ORDER — LEVOTHYROXINE SODIUM 75 MCG PO TABS: 75.0000 ug | ORAL_TABLET | Freq: Every day | ORAL | 0 refills | Status: DC

## 2023-04-24 NOTE — Telephone Encounter (Signed)
 Copied from CRM [id number]. Topic: Clinical - Prescription Issue >> Apr 23, 2023  5:00 PM Corin V wrote: Reason for CRM: Patient called in and stated her pharmacy told her the refill request for her levothyroxine  was denied. Agent advised that they are not seeing any denial reason listed in the chart, but it may be that Dr. Berneta has not gotten a chance to send the script into the pharmacy yet. Patient has one pill remaining, please let her know when it is sent in or if there is a reason for the denial so she can help get the medication refilled.   Patient scheduled for an appt on 05/02/23 and rx sent to the pharmacy. Dm/cma

## 2023-05-02 ENCOUNTER — Ambulatory Visit: Payer: 59 | Admitting: Family Medicine

## 2023-05-02 ENCOUNTER — Telehealth: Payer: Self-pay | Admitting: Family Medicine

## 2023-05-07 NOTE — Telephone Encounter (Signed)
 2.13.25 pt arrived to her appt 15 minutes late due to car trouble - did not count as missed visit

## 2023-07-16 ENCOUNTER — Other Ambulatory Visit: Payer: Self-pay | Admitting: Family Medicine

## 2023-07-16 NOTE — Telephone Encounter (Signed)
 Copied from CRM [id number]. Topic: Clinical - Medication Refill >> Jul 16, 2023 12:08 PM Luane Rumps D wrote: Most Recent Primary Care Visit:  Provider: Tonna Frederic  Department: [facility]  Visit Type: OFFICE VISIT  Date: 04/18/2022  Medication: levothyroxine  (SYNTHROID ) 75 MCG tablet, Patient going on vacation 5/4-5/11 will be 5 days short of medication - wondering if it can be filled or if anything a bridge supply to last her her trip.  Has the patient contacted their pharmacy? No (Agent: If no, request that the patient contact the pharmacy for the refill. If patient does not wish to contact the pharmacy document the reason why and proceed with request.) (Agent: If yes, when and what did the pharmacy advise?)  Is this the correct pharmacy for this prescription? Yes If no, delete pharmacy and type the correct one.  This is the patient's preferred pharmacy:   [facility] [place] [facility] [address] [place] [address] Phone: [phone] Fax: [phone]  Has the prescription been filled recently? Yes  Is the patient out of the medication? No  Has the patient been seen for an appointment in the last year OR does the patient have an upcoming appointment? Yes  Can we respond through MyChart? Yes  Agent: Please be advised that Rx refills may take up to 3 business days. We ask that you follow-up with your pharmacy.

## 2023-07-21 ENCOUNTER — Other Ambulatory Visit: Payer: Self-pay | Admitting: Family Medicine

## 2023-07-22 ENCOUNTER — Other Ambulatory Visit: Payer: Self-pay | Admitting: Family Medicine

## 2023-07-22 ENCOUNTER — Ambulatory Visit: Payer: 59 | Admitting: Family Medicine

## 2023-07-29 ENCOUNTER — Ambulatory Visit: Admitting: Family Medicine

## 2023-07-29 ENCOUNTER — Encounter: Payer: Self-pay | Admitting: Family Medicine

## 2023-07-29 VITALS — BP 100/64 | HR 83 | Temp 97.2°F | Ht 63.0 in | Wt 124.8 lb

## 2023-07-29 DIAGNOSIS — Z131 Encounter for screening for diabetes mellitus: Secondary | ICD-10-CM

## 2023-07-29 DIAGNOSIS — Z0000 Encounter for general adult medical examination without abnormal findings: Secondary | ICD-10-CM | POA: Diagnosis not present

## 2023-07-29 DIAGNOSIS — J301 Allergic rhinitis due to pollen: Secondary | ICD-10-CM

## 2023-07-29 DIAGNOSIS — Z1211 Encounter for screening for malignant neoplasm of colon: Secondary | ICD-10-CM

## 2023-07-29 DIAGNOSIS — E039 Hypothyroidism, unspecified: Secondary | ICD-10-CM | POA: Diagnosis not present

## 2023-07-29 DIAGNOSIS — Z13220 Encounter for screening for lipoid disorders: Secondary | ICD-10-CM

## 2023-07-29 LAB — COMPREHENSIVE METABOLIC PANEL WITH GFR
ALT: 16 U/L (ref 0–35)
AST: 20 U/L (ref 0–37)
Albumin: 4.6 g/dL (ref 3.5–5.2)
Alkaline Phosphatase: 54 U/L (ref 39–117)
BUN: 12 mg/dL (ref 6–23)
CO2: 29 meq/L (ref 19–32)
Calcium: 9.5 mg/dL (ref 8.4–10.5)
Chloride: 104 meq/L (ref 96–112)
Creatinine, Ser: 0.96 mg/dL (ref 0.40–1.20)
GFR: 68.39 mL/min (ref >60.00)
Glucose, Bld: 83 mg/dL (ref 70–99)
Potassium: 3.8 meq/L (ref 3.5–5.1)
Sodium: 140 meq/L (ref 135–145)
Total Bilirubin: 0.6 mg/dL (ref 0.2–1.2)
Total Protein: 7.4 g/dL (ref 6.0–8.3)

## 2023-07-29 LAB — URINALYSIS, ROUTINE W REFLEX MICROSCOPIC
Bilirubin Urine: NEGATIVE
Ketones, ur: NEGATIVE
Leukocytes,Ua: NEGATIVE
Nitrite: NEGATIVE
Specific Gravity, Urine: 1.015 (ref 1.000–1.030)
Total Protein, Urine: NEGATIVE
Urine Glucose: NEGATIVE
Urobilinogen, UA: 0.2 (ref 0.0–1.0)
pH: 6 (ref 5.0–8.0)

## 2023-07-29 LAB — LIPID PANEL
Cholesterol: 263 mg/dL — ABNORMAL HIGH (ref 0–200)
HDL: 75.7 mg/dL (ref >39.00)
LDL Cholesterol: 175 mg/dL — ABNORMAL HIGH (ref 0–99)
NonHDL: 187.75
Total CHOL/HDL Ratio: 3
Triglycerides: 63 mg/dL (ref 0.0–149.0)
VLDL: 12.6 mg/dL (ref 0.0–40.0)

## 2023-07-29 LAB — CBC
HCT: 40.7 % (ref 36.0–46.0)
Hemoglobin: 13.5 g/dL (ref 12.0–15.0)
MCHC: 33.3 g/dL (ref 30.0–36.0)
MCV: 87.6 fl (ref 78.0–100.0)
Platelets: 212 10*3/uL (ref 150.0–400.0)
RBC: 4.65 Mil/uL (ref 3.87–5.11)
RDW: 13 % (ref 11.5–15.5)
WBC: 3.3 10*3/uL — ABNORMAL LOW (ref 4.0–10.5)

## 2023-07-29 LAB — TSH: TSH: 1.82 u[IU]/mL (ref 0.35–5.50)

## 2023-07-29 LAB — HEMOGLOBIN A1C: Hgb A1c MFr Bld: 5.5 % (ref 4.6–6.5)

## 2023-07-29 MED ORDER — MOMETASONE FUROATE 50 MCG/ACT NA SUSP: 2.0000 | Freq: Every day | NASAL | 12 refills | Status: AC

## 2023-07-29 NOTE — Progress Notes (Signed)
 Established Patient Office Visit   Subjective:  Patient ID: Erica Wallace, female    DOB: 1970/05/10  Age: 53 y.o. MRN: [id number]  Chief Complaint  Patient presents with   Medical Management of Chronic Issues    Follow up. Pt is fasting. Pt wants nasal streoid.     HPI Encounter Diagnoses  Name Primary?   Healthcare maintenance Yes   Screening for cholesterol level    Screening for diabetes mellitus    Acquired hypothyroidism    Seasonal allergic rhinitis due to pollen    Screening for colon cancer    For follow-up of above.  Continues levothyroxine  75 mcg daily on a fasting stomach for hypothyroidism.  She is experiencing nasal congestion with postnasal drip and occasional sneezing.  Nasal steroids have worked well in the past for her.  Up-to-date on health maintenance but is due for first colonoscopy.  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  She does have regular dental care.  She does exercise regularly.  Her youngest child is graduating from high school this year.   Review of Systems  Constitutional: Negative.   HENT: Negative.    Eyes:  Negative for blurred vision, discharge and redness.  Respiratory: Negative.    Cardiovascular: Negative.   Gastrointestinal:  Negative for abdominal pain.  Genitourinary: Negative.   Musculoskeletal: Negative.  Negative for myalgias.  Skin:  Negative for rash.  Neurological:  Negative for tingling, loss of consciousness and weakness.  Endo/Heme/Allergies:  Negative for polydipsia.     Current Outpatient Medications:    levothyroxine  (SYNTHROID ) 75 MCG tablet, TAKE 1 TABLET BY MOUTH DAILY BEFORE BREAKFAST., Disp: 90 tablet, Rfl: 0   mometasone  (NASONEX ) 50 MCG/ACT nasal spray, Place 2 sprays into the nose daily., Disp: 1 each, Rfl: 12   Objective:     BP 100/64 (Cuff Size: Normal)   Pulse 83   Temp (!) 97.2 F (36.2 C)  (Temporal)   Ht 5\' 3"  (1.6 m)   Wt 124 lb 12.8 oz (56.6 kg)   SpO2 96%   BMI 22.11 kg/m    Physical Exam Constitutional:      General: She is not in acute distress.    Appearance: Normal appearance. She is not ill-appearing, toxic-appearing or diaphoretic.  HENT:     Head: Normocephalic and atraumatic.     Right Ear: External ear normal.     Left Ear: External ear normal.     Mouth/Throat:     Mouth: Mucous membranes are moist.     Pharynx: Oropharynx is clear. No oropharyngeal exudate or posterior oropharyngeal erythema.  Eyes:     General: No scleral icterus.       Right eye: No discharge.        Left eye: No discharge.     Extraocular Movements: Extraocular movements intact.     Conjunctiva/sclera: Conjunctivae normal.     Pupils: Pupils are equal, round, and reactive to light.  Neck:     Thyroid : No thyromegaly.  Cardiovascular:     Rate and Rhythm: Normal rate and regular rhythm.  Pulmonary:     Effort: Pulmonary effort is normal. No respiratory distress.     Breath sounds: Normal breath sounds.  Abdominal:     General: Bowel sounds are normal.  Musculoskeletal:     Cervical back: No rigidity or tenderness.  Lymphadenopathy:     Cervical: No  cervical adenopathy.  Skin:    General: Skin is warm and dry.  Neurological:     Mental Status: She is alert and oriented to person, place, and time.  Psychiatric:        Mood and Affect: Mood normal.        Behavior: Behavior normal.      No results found for any visits on 07/29/23.    The 10-year ASCVD risk score (Arnett DK, et al., 2019) is: 0.8%    Assessment & Plan:   Healthcare maintenance -     CBC -     Urinalysis, Routine w reflex microscopic  Screening for cholesterol level -     Comprehensive metabolic panel with GFR -     Lipid panel  Screening for diabetes mellitus -     Comprehensive metabolic panel with GFR -     Hemoglobin A1c  Acquired hypothyroidism -     TSH  Seasonal allergic  rhinitis due to pollen -     Mometasone  Furoate; Place 2 sprays into the nose daily.  Dispense: 1 each; Refill: 12  Screening for colon cancer    Return in about 1 year (around 07/28/2024), or if symptoms worsen or fail to improve.  Information was given on health maintenance and disease prevention.  Encouraged healthy active lifestyle.  Prescription for Nasonex  or seasonal allergies.  Discussed headaches and epistaxis as possible side effects.  Adjustments to levothyroxine  made pending results of TSH.  Information was given on the Cologuard we discussed the process.  Tonna Frederic, MD

## 2023-08-14 DIAGNOSIS — L309 Dermatitis, unspecified: Secondary | ICD-10-CM | POA: Diagnosis not present

## 2023-08-14 DIAGNOSIS — Z1231 Encounter for screening mammogram for malignant neoplasm of breast: Secondary | ICD-10-CM | POA: Diagnosis not present

## 2023-08-14 DIAGNOSIS — Z1211 Encounter for screening for malignant neoplasm of colon: Secondary | ICD-10-CM | POA: Diagnosis not present

## 2023-08-14 DIAGNOSIS — Z6822 Body mass index (BMI) 22.0-22.9, adult: Secondary | ICD-10-CM | POA: Diagnosis not present

## 2023-08-14 DIAGNOSIS — Z760 Encounter for issue of repeat prescription: Secondary | ICD-10-CM | POA: Diagnosis not present

## 2023-08-14 DIAGNOSIS — Z01419 Encounter for gynecological examination (general) (routine) without abnormal findings: Secondary | ICD-10-CM | POA: Diagnosis not present

## 2023-08-14 DIAGNOSIS — N390 Urinary tract infection, site not specified: Secondary | ICD-10-CM | POA: Diagnosis not present

## 2023-08-29 DIAGNOSIS — Z1211 Encounter for screening for malignant neoplasm of colon: Secondary | ICD-10-CM | POA: Diagnosis not present

## 2023-09-03 LAB — COLOGUARD: COLOGUARD: NEGATIVE

## 2023-10-21 ENCOUNTER — Other Ambulatory Visit: Payer: Self-pay | Admitting: Family Medicine

## 2023-12-16 ENCOUNTER — Other Ambulatory Visit: Payer: Self-pay | Admitting: Family Medicine
